# Patient Record
Sex: Female | Born: 1960 | Race: White | Hispanic: No | Smoking: Never smoker
Health system: Southern US, Community
[De-identification: ages and names within clinical notes are randomized; demographics above are authoritative.]

## PROBLEM LIST (undated history)

## (undated) DIAGNOSIS — G43909 Migraine, unspecified, not intractable, without status migrainosus: Secondary | ICD-10-CM

## (undated) DIAGNOSIS — M1990 Unspecified osteoarthritis, unspecified site: Secondary | ICD-10-CM

## (undated) DIAGNOSIS — E039 Hypothyroidism, unspecified: Secondary | ICD-10-CM

## (undated) DIAGNOSIS — R32 Unspecified urinary incontinence: Secondary | ICD-10-CM

## (undated) DIAGNOSIS — R51 Headache: Secondary | ICD-10-CM

## (undated) DIAGNOSIS — F329 Major depressive disorder, single episode, unspecified: Secondary | ICD-10-CM

## (undated) DIAGNOSIS — G629 Polyneuropathy, unspecified: Secondary | ICD-10-CM

## (undated) DIAGNOSIS — C029 Malignant neoplasm of tongue, unspecified: Secondary | ICD-10-CM

## (undated) DIAGNOSIS — F32A Depression, unspecified: Secondary | ICD-10-CM

## (undated) DIAGNOSIS — Z85048 Personal history of other malignant neoplasm of rectum, rectosigmoid junction, and anus: Secondary | ICD-10-CM

## (undated) DIAGNOSIS — E079 Disorder of thyroid, unspecified: Secondary | ICD-10-CM

## (undated) HISTORY — DX: Unspecified osteoarthritis, unspecified site: M19.90

## (undated) HISTORY — DX: Personal history of other malignant neoplasm of rectum, rectosigmoid junction, and anus: Z85.048

## (undated) HISTORY — DX: Depression, unspecified: F32.A

## (undated) HISTORY — DX: Polyneuropathy, unspecified: G62.9

## (undated) HISTORY — DX: Migraine, unspecified, not intractable, without status migrainosus: G43.909

## (undated) HISTORY — DX: Malignant neoplasm of tongue, unspecified: C02.9

## (undated) HISTORY — DX: Hypothyroidism, unspecified: E03.9

## (undated) HISTORY — DX: Headache: R51

## (undated) HISTORY — DX: Unspecified urinary incontinence: R32

## (undated) HISTORY — DX: Major depressive disorder, single episode, unspecified: F32.9

## (undated) HISTORY — DX: Disorder of thyroid, unspecified: E07.9

---
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
## 1991-07-02 HISTORY — PX: BREAST REDUCTION SURGERY: SHX8

## 1993-07-01 HISTORY — PX: CARPAL TUNNEL RELEASE: SHX101

## 2001-03-25 ENCOUNTER — Emergency Department (HOSPITAL_COMMUNITY): Admission: EM | Admit: 2001-03-25 | Discharge: 2001-03-25 | Payer: Self-pay | Admitting: Emergency Medicine

## 2008-01-24 ENCOUNTER — Observation Stay (HOSPITAL_COMMUNITY): Admission: EM | Admit: 2008-01-24 | Discharge: 2008-01-25 | Payer: Self-pay | Admitting: Emergency Medicine

## 2008-01-25 ENCOUNTER — Encounter (INDEPENDENT_AMBULATORY_CARE_PROVIDER_SITE_OTHER): Payer: Self-pay | Admitting: *Deleted

## 2008-05-17 ENCOUNTER — Ambulatory Visit (HOSPITAL_COMMUNITY): Payer: Self-pay | Admitting: Psychology

## 2008-05-25 ENCOUNTER — Ambulatory Visit (HOSPITAL_COMMUNITY): Payer: Self-pay | Admitting: Psychology

## 2008-06-08 ENCOUNTER — Ambulatory Visit (HOSPITAL_COMMUNITY): Payer: Self-pay | Admitting: Psychology

## 2009-06-18 IMAGING — CR DG CHEST 2V
2 series · 2 of 2 positions shown · non-contrast
Comparison: None

CLINICAL DATA: Chest pain, chest tightness

CHEST - 2 VIEW

[w chest pa]
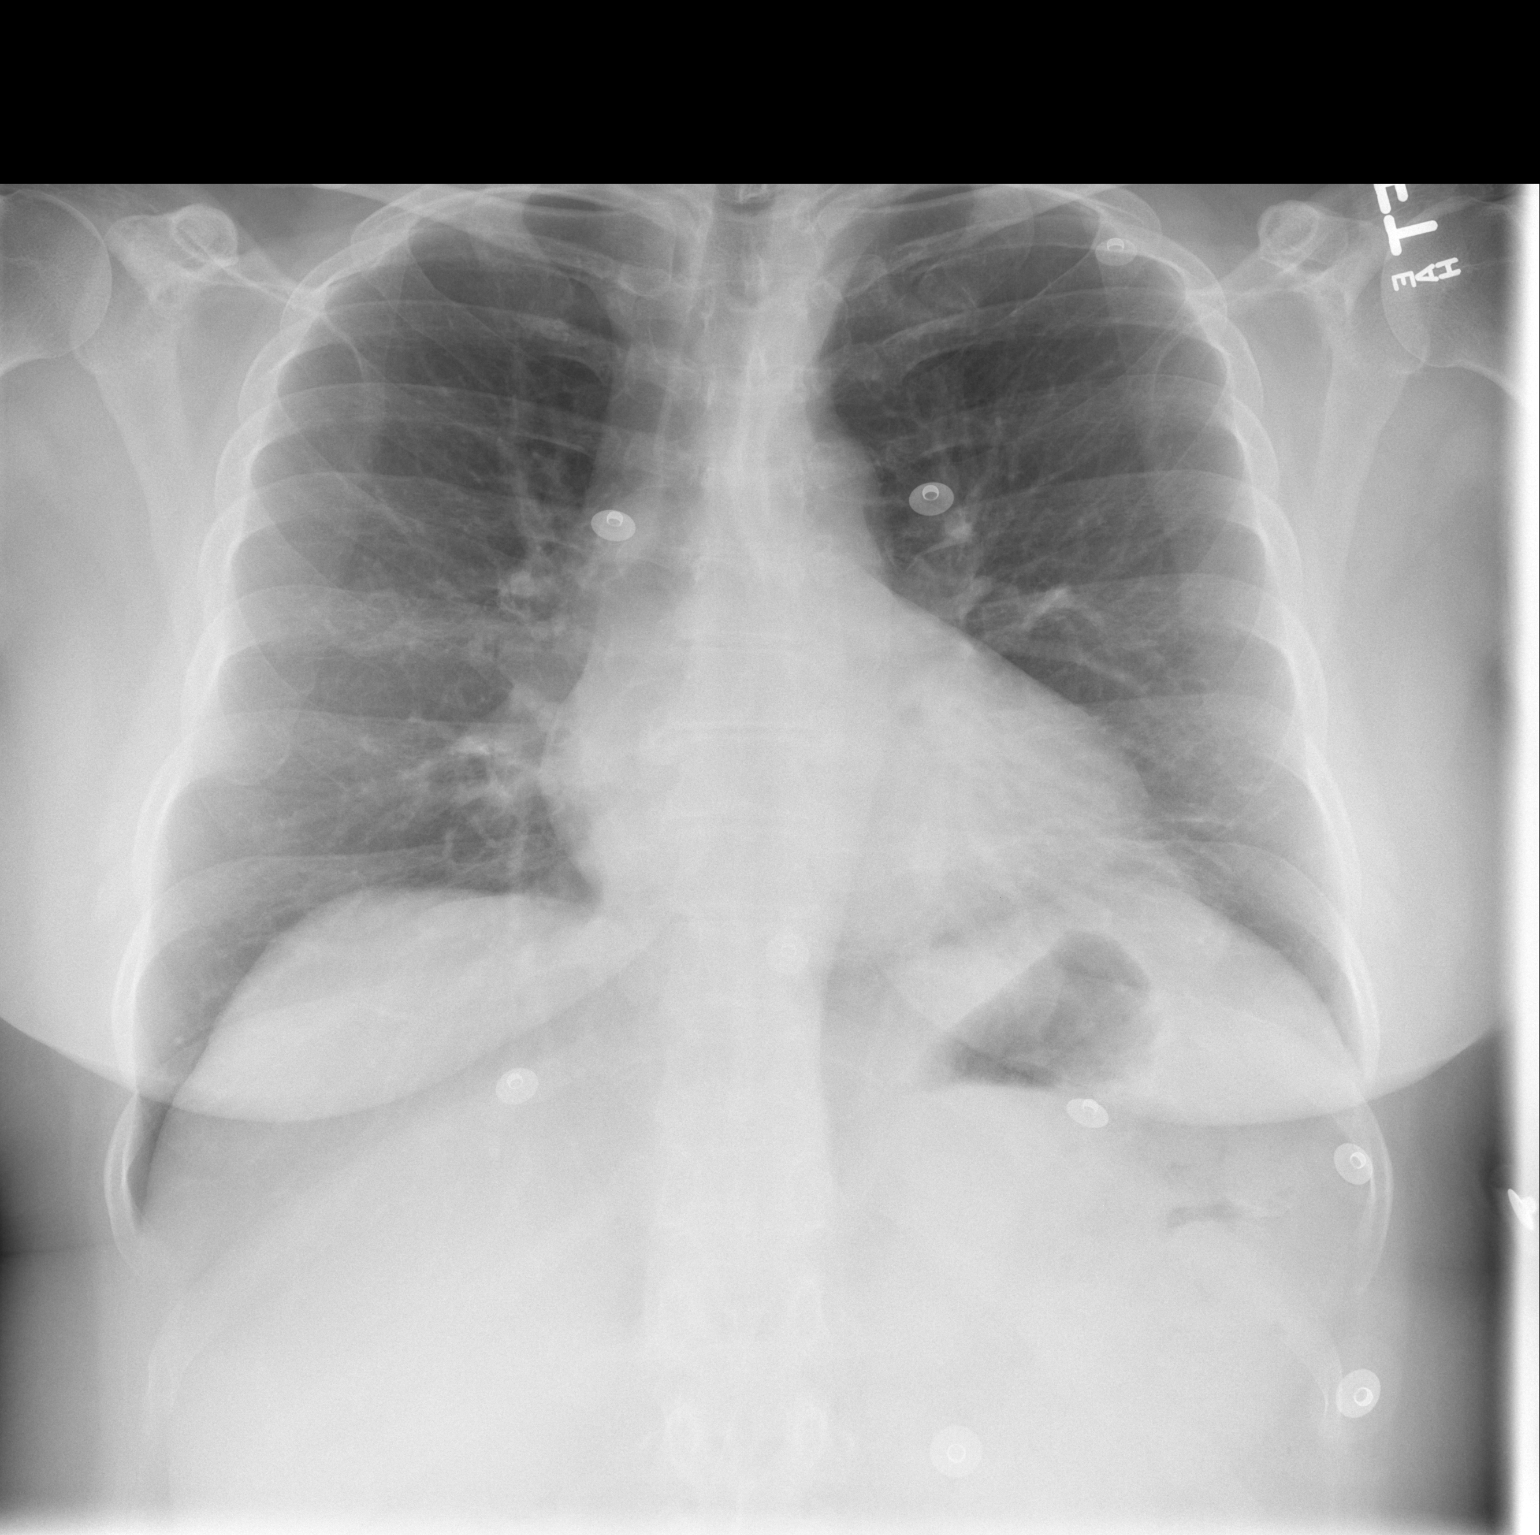

[w chest lat]
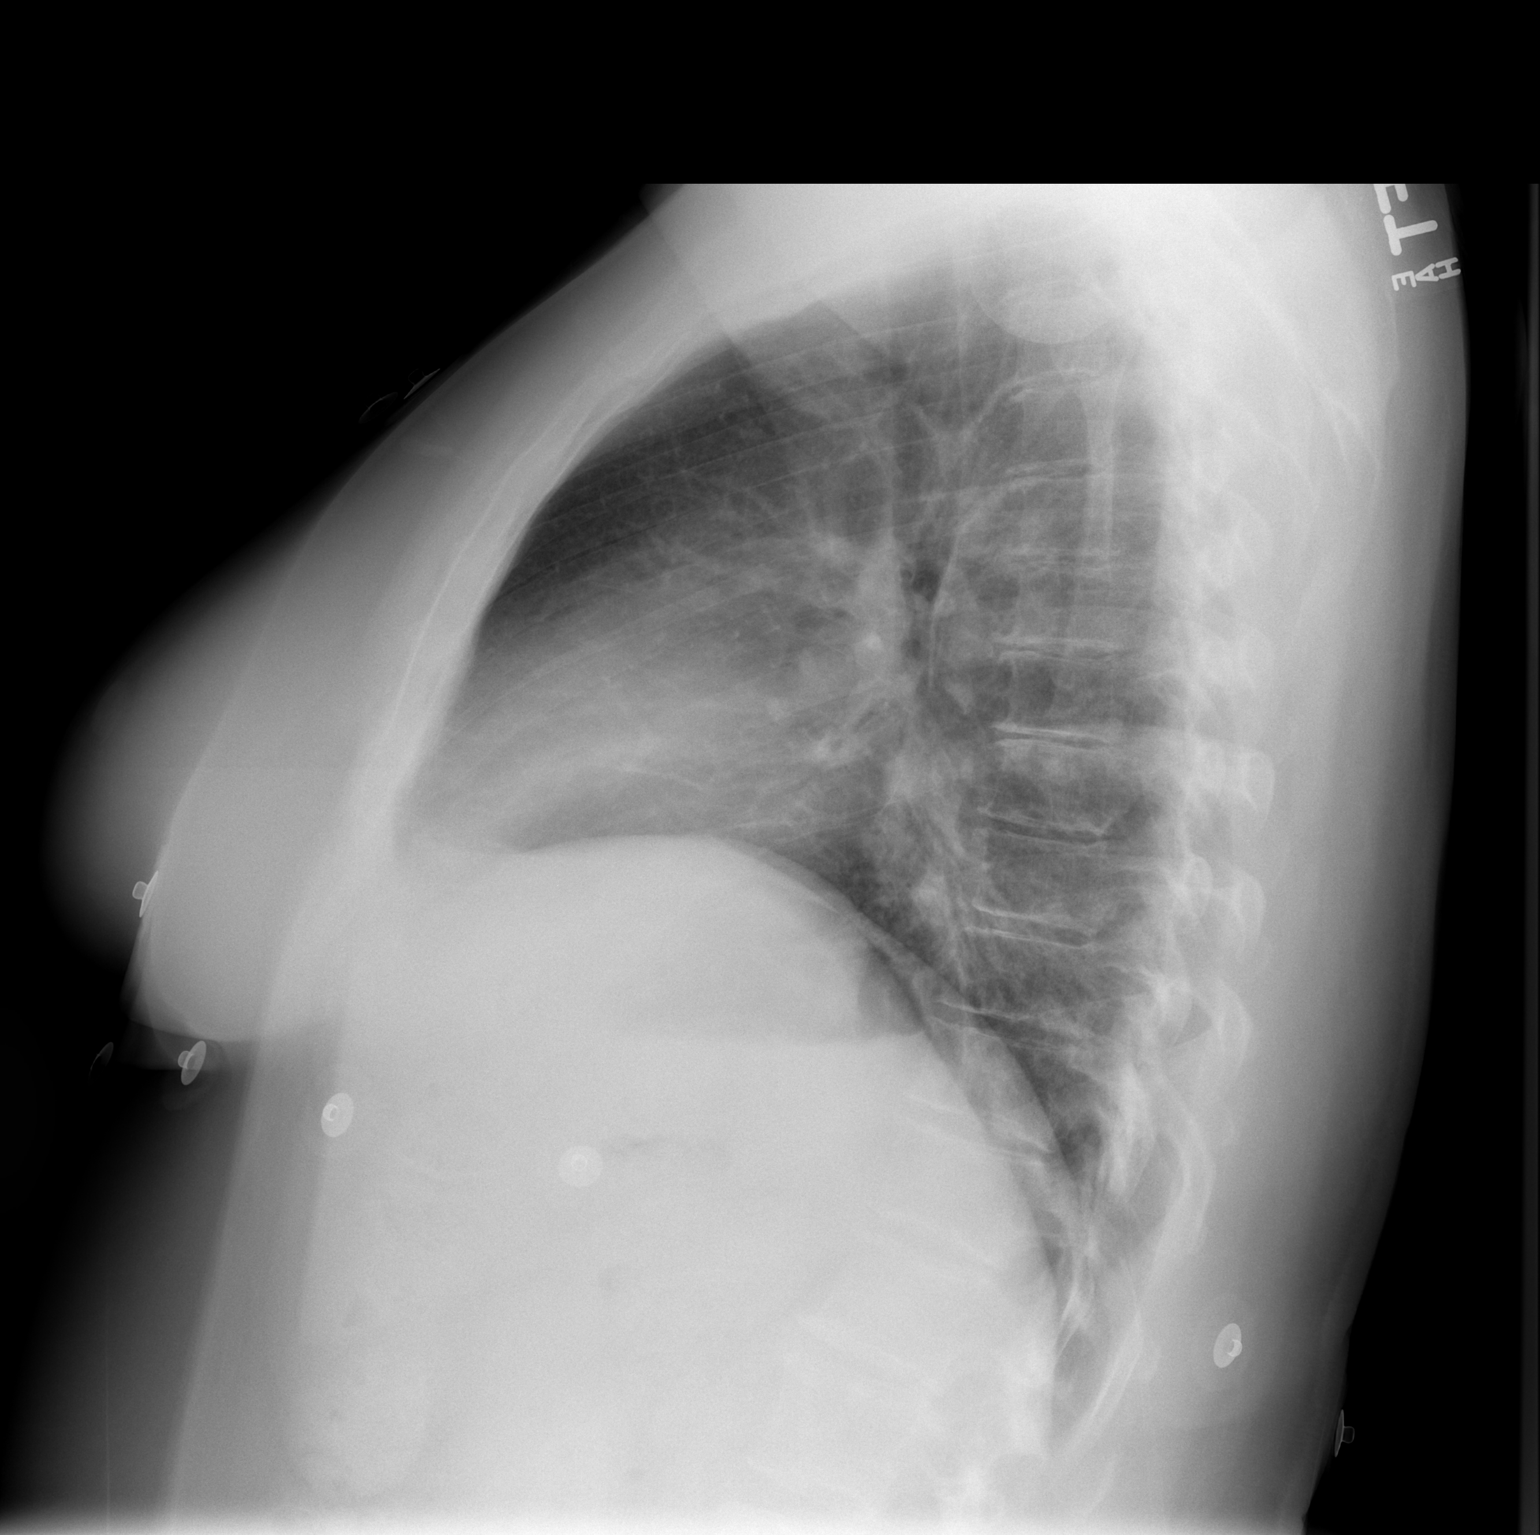

[2 of 2 positions shown; findings below may reference images not displayed]

FINDINGS: Normal mediastinum and heart silhouette.  Left
retrocardiac streaky opacity.  Costophrenic angles are clear.  No
pneumothorax.  Normal pulmonary vasculature.
IMPRESSION: Left lower lobe atelectasis versus infiltrate.

## 2009-08-31 ENCOUNTER — Ambulatory Visit: Payer: Self-pay | Admitting: Diagnostic Radiology

## 2009-08-31 ENCOUNTER — Emergency Department (HOSPITAL_BASED_OUTPATIENT_CLINIC_OR_DEPARTMENT_OTHER): Admission: EM | Admit: 2009-08-31 | Discharge: 2009-08-31 | Payer: Self-pay | Admitting: Emergency Medicine

## 2010-07-01 HISTORY — PX: OVARIAN CYST SURGERY: SHX726

## 2010-07-22 ENCOUNTER — Encounter: Payer: Self-pay | Admitting: Unknown Physician Specialty

## 2010-07-24 ENCOUNTER — Encounter
Admission: RE | Admit: 2010-07-24 | Discharge: 2010-07-24 | Payer: Self-pay | Source: Home / Self Care | Attending: Obstetrics and Gynecology | Admitting: Obstetrics and Gynecology

## 2010-08-15 ENCOUNTER — Ambulatory Visit (HOSPITAL_COMMUNITY)
Admission: RE | Admit: 2010-08-15 | Discharge: 2010-08-15 | Disposition: A | Discharge status: Home / Self Care | Payer: BC Managed Care – PPO | Source: Ambulatory Visit | Attending: Obstetrics & Gynecology | Admitting: Obstetrics & Gynecology

## 2010-08-15 ENCOUNTER — Other Ambulatory Visit: Payer: Self-pay | Admitting: Obstetrics & Gynecology

## 2010-08-15 DIAGNOSIS — R1900 Intra-abdominal and pelvic swelling, mass and lump, unspecified site: Secondary | ICD-10-CM | POA: Insufficient documentation

## 2010-08-15 DIAGNOSIS — N949 Unspecified condition associated with female genital organs and menstrual cycle: Secondary | ICD-10-CM | POA: Insufficient documentation

## 2010-08-15 DIAGNOSIS — N838 Other noninflammatory disorders of ovary, fallopian tube and broad ligament: Secondary | ICD-10-CM | POA: Insufficient documentation

## 2010-08-15 LAB — CBC
Hemoglobin: 13.6 g/dL (ref 12.0–15.0)
MCH: 29.8 pg (ref 26.0–34.0)
MCHC: 32.8 g/dL (ref 30.0–36.0)
RDW: 13.4 % (ref 11.5–15.5)

## 2010-08-15 LAB — TYPE AND SCREEN
ABO/RH(D): B POS
Antibody Screen: NEGATIVE

## 2010-09-12 NOTE — Op Note (Signed)
NAME:  Alice Pena, Alice Pena               ACCOUNT NO.:  [id number]  MEDICAL RECORD NO.:  [id number]           PATIENT TYPE:  O  LOCATION:  [facility]                          [facility]  PHYSICIAN:  Genia Del, M.D.DATE OF BIRTH:  [birth year]  DATE OF PROCEDURE:  08/15/2010 DATE OF DISCHARGE:                              OPERATIVE REPORT   PREOPERATIVE DIAGNOSIS:  Left adnexal lesion with left pelvic pain.  POSTOPERATIVE DIAGNOSES:  Left adnexal lesion with left pelvic pain, probable left paratubal adipose tissue and free lesion in pelvis.  PROCEDURE:  Laparoscopy, removal of free pelvic lesion, and excision of left paratubal lesion (probable adipose tissue).  SURGEON:  Genia Del, M.D.  ASSISTANT:  Arlan Organ, MD.  PROCEDURE IN DETAIL:  Under general anesthesia with endotracheal intubation, the patient is in lithotomy position.  She received a dose of Ancef 1 g IV before starting the procedure.  We prepped with Betadine on the abdominal, suprapubic, vulvar, and vaginal areas and draped as usual.  The vaginal exam revealed an anteverted uterus, normal volume, no adnexal mass felt.  The Foley is inserted in the bladder.  We then inserted the speculum in the vagina.  We grasped the anterior lip of the cervix with a tenaculum, and we cannulated the uterus with a one-tooth tenaculum cannula.  We removed the tenaculum on the anterior lip and removed the speculum.  We went to the abdomen.  We infiltrated the subcutaneous tissue with Marcaine 0.25 plain, 7 mL at the infraumbilical area.  We made a 1-cm incision with a scalpel at that level.  We opened the aponeurosis under direct vision with Mayo scissors and opened the parietal peritoneum under direct vision with Mayo scissors as well.  We put a pursestring stitch of Vicryl zero at the aponeurosis.  We inserted the Hasson at that level and then the camera.  The inspection of the pelvic cavity reveals a normal size and a  normal shape of the uterus. Both tubes are status post tubal with the proximal pole portion slightly dilated.  Both ovaries are normal to inspection.  We then put an additional port in the right iliac area.  We infiltrated the subcutaneous tissue with Marcaine 0.25 plain, 2 mL.  We made a 5-mm incision with a scalpel and entered a 5-mm trocar at that level.  We noted a 1-cm solid round lesion in the anterior cul-de-sac.  We switched to a 5-mm camera and removed that lesion through the infraumbilical port, that was sent to pathology.  We then inspected the pelvic cavity further.  There was no other lesion anteriorly.  No lesion in the posterior cul-de-sac.  The right ovary was completely normal.  The left ovary was completely normal.  At the level of the distal left tube, there was a 2-cm lesion which looked like adipose tissue attached to the left tube.  The decision was made to excise that lesion especially given that the location corresponded very well to the description by ultrasound and MRI, as well as the size.  We made an additional incision on the left iliac area after infiltrating Marcaine 0.25 plain.  We used a scalpel and made a 5-mm incision.  A 5-mm trocar was inserted at that level.  We were back with the regular camera at the infraumbilical port. We used the EnSeal to coagulate and section the left paratubal lesion. This was removed through a 5-mm port as the diameter was less than 5. We sent it to pathology with the other specimen.  Hemostasis was adequate at that level.  We visualized the appendix and took pictures. We visualized the liver and took pictures.  No abdominal lesion was present.  We took pictures before and after excising the left adnexal lesion in the pelvis.  The estimated blood loss was minimal.  The count of instruments and sponges was complete.  No complications occurred, and the patient was brought to recovery room in good stable  status.     Genia Del, M.D.     ML/MEDQ  D:  08/15/2010  T:  08/16/2010  Job:  [id number]  Electronically Signed by Genia Del M.D. on 09/12/2010 06:23:06 PM

## 2010-11-13 NOTE — H&P (Signed)
NAME:  Alice Pena, LOVE NO.:  [id number]   MEDICAL RECORD NO.:  [id number]          PATIENT TYPE:  INP   LOCATION:  3705                         FACILITY:  [facility]   PHYSICIAN:  Della Goo, M.D. DATE OF BIRTH:  Mar 12, 1961   DATE OF ADMISSION:  01/23/2008  DATE OF DISCHARGE:                              HISTORY & PHYSICAL   PRIMARY CARE PHYSICIAN:  Unassigned.   CHIEF COMPLAINT:  Chest pain.   HISTORY OF PRESENT ILLNESS:  This is a 50 year old female who was sent  emergently to the hospital secondary to complaints of severe substernal  area chest pain that started at about 8:30 p.m. while she was on her  job.  She reports having palpitations as well.  She reports having  associated symptoms of shortness of breath, nausea, diaphoresis and  describes the pain as being a heaviness in her chest which went across  the chest.  The patient described the pain as being a 10 out of 10 when  it did start.  At her job they took her vital signs and her blood  pressure was also found to be elevated and her heart rate was found to  be 145.  She was administered to nitroglycerin tablets and aspirin  therapy prior to emergency medical services arriving.  The patient was  given an additional nitroglycerin when emergency medical services  arrived.  When the patient arrived to the hospital she still had mild  chest discomfort, reported it as being a 4 out of 10. The patient denies  having any previous similar episodes to this.   PAST MEDICAL HISTORY:  Significant for hypothyroidism and history of  dysfunctional uterine bleeding.   MEDICATIONS:  At this time include:  1. Synthroid 0.75 mcg 1 p.o. daily.  2. Oral contraceptive pills.   ALLERGIES:  No known drug allergies.   SOCIAL HISTORY:  Nonsmoker, nondrinker.   FAMILY HISTORY:  Positive for ovarian cancer in her paternal grandmother  and no history of coronary artery disease, diabetes or hypertension in  her family.   REVIEW OF SYSTEMS:  Pertinents are mentioned above.   PHYSICAL EXAMINATION FINDINGS:  This is an overweight 50 year old female  in discomfort but no acute distress currently her.  Vital Signs:  Temperature 97.2.  Blood pressure 108/64.  Heart rate 74.  Respirations 20.  O2 saturation 99%.  HEENT:  Normocephalic, atraumatic.  There is no scleral icterus.  Pupils  are equally round reactive to light.  Extraocular movements are intact.  Funduscopic benign.  Oropharynx clear.  NECK:  Supple, full range of motion.  No thyromegaly, adenopathy or  jugular venous distention.  CARDIOVASCULAR:  Regular rate and rhythm.  No murmurs, gallops or rubs.  LUNGS:  Clear to auscultation bilaterally.  ABDOMEN:  Positive bowel sounds, soft, nontender, nondistended.  EXTREMITIES:  Without cyanosis, clubbing or edema.  NEUROLOGIC:  Nonfocal.   LABORATORY STUDIES:  White blood cell count 7.1, hemoglobin 12.2,  hematocrit 36.3 platelets 246, MCV 91.3, neutrophils 61%, lymphocytes  27%.  Sodium 139, potassium 4.0, chloride 107, bicarbonate 22, BUN 14,  creatinine 0.8 and  glucose 98.  Cardiac enzymes, a myoglobin of 28.0, CK-  MB less than 1.0 and troponin less than 0.05, D-dimer 0.44.  Chest x-ray  reveals left lower lobe atelectasis.   ASSESSMENT:  A 50 year old female being admitted with:  1. Substernal chest pain.  2. Palpitations.  3. Hypothyroidism.   PLAN:  The patient will be admitted to telemetry area.  Cardiac enzymes  will be performed.  The patient will be placed on beta blocker therapy,  nitrates, oxygen and aspirin therapy, p.r.n. sublingual nitroglycerin as  also been ordered.  The patient will be placed on DVT and GI  prophylaxis.  Further workup will ensue pending results of the patient's  clinical course and he studies.      Della Goo, M.D.  Electronically Signed     HJ/MEDQ  D:  01/24/2008  T:  01/24/2008  Job:  [id number]

## 2010-11-13 NOTE — Discharge Summary (Signed)
NAME:  Alice Pena, Alice Pena NO.:  [id number]   MEDICAL RECORD NO.:  [id number]         PATIENT TYPE:  CINP   LOCATION:                               FACILITY:  [facility]   PHYSICIAN:  Beckey Rutter, MD  DATE OF BIRTH:  1961-04-15   DATE OF ADMISSION:  01/24/2008  DATE OF DISCHARGE:                               DISCHARGE SUMMARY   PRIMARY CARE PHYSICIAN:  She is unassigned to [facility].   HISTORY OF PRESENT ILLNESS AND CHIEF COMPLAINT:  Briefly, this is a 50-  year-old female who presented with chest pain.   HOSPITAL COURSE:  1. During the hospital stay, the patient was ruled out for acute      coronary syndrome and myocardial infarction by serial EKG tracings      and cardiac enzymes.  The chest pain was resolved and the patient      is feeling better now with Tylenol medications.  I discussed the      findings with the patient and her husband and they elected to      follow up with the primary physician for further stratification.      She will have a 2-D echo today, although the result is pending, but      the patient is very stable to be released home.  She worked as a      Buyer, retail at [employer] and she stated that she      will follow up with the echo on her own.  2. The patient was found to have evidence of lower lobe atelectasis      for which she was started on Avelox.  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  Followup chest x-      ray was also recommended and she is aware and agreeable to this      discharge plan.  3. Hypothyroidism.  The patient was continued on her 75 mcg dose of      Synthroid.  Her TSH number is 5.114.   DISCHARGE DIAGNOSES:  1. Chest pain.  The patient ruled out for acute coronary      syndromes/myocardial infarction.  2. Evidence of  pneumonia on the chest x-ray.  3. Hypothyroidism.   DISCHARGE MEDICATIONS:  1. Synthroid 75 mcg daily.  2. Avelox to finish a course of 5 days, prescription given.   DISCHARGE PLAN:  The patient is discharged to follow up with her primary  physician to continue further assessment and stratification for heart  disease as discussed with her.       Beckey Rutter, MD  Electronically Signed     EME/MEDQ  D:  01/25/2008  T:  01/26/2008  Job:  [id number]

## 2011-03-29 LAB — CARDIAC PANEL(CRET KIN+CKTOT+MB+TROPI)
Relative Index: INVALID
Total CK: 76

## 2011-03-29 LAB — CK TOTAL AND CKMB (NOT AT ARMC)
CK, MB: 1.3
Relative Index: INVALID
Total CK: 89

## 2011-03-29 LAB — DIFFERENTIAL
Basophils Absolute: 0
Basophils Relative: 1
Eosinophils Relative: 1
Monocytes Absolute: 0.8
Monocytes Relative: 11

## 2011-03-29 LAB — CBC
HCT: 34.7 — ABNORMAL LOW
HCT: 36.3
Hemoglobin: 11.6 — ABNORMAL LOW
Hemoglobin: 12.2
MCHC: 33.3
MCHC: 33.6
MCV: 91.3
MCV: 91.4
RBC: 3.79 — ABNORMAL LOW
RDW: 12.8
RDW: 13

## 2011-03-29 LAB — BASIC METABOLIC PANEL
CO2: 28
Calcium: 8.7
Creatinine, Ser: 0.67
GFR calc non Af Amer: >60
Glucose, Bld: 113 — ABNORMAL HIGH
Sodium: 142

## 2011-03-29 LAB — POCT I-STAT, CHEM 8
BUN: 14
Calcium, Ion: 1.02 — ABNORMAL LOW
Chloride: 107
Glucose, Bld: 98
Potassium: 4

## 2011-03-29 LAB — TSH: TSH: 5.114 — ABNORMAL HIGH

## 2011-07-08 ENCOUNTER — Ambulatory Visit: Payer: BC Managed Care – PPO | Admitting: Family

## 2011-07-15 ENCOUNTER — Ambulatory Visit (INDEPENDENT_AMBULATORY_CARE_PROVIDER_SITE_OTHER): Payer: BC Managed Care – PPO | Admitting: Family

## 2011-07-15 ENCOUNTER — Encounter: Payer: Self-pay | Admitting: Family

## 2011-07-15 VITALS — BP 120/80 | Temp 98.2°F | Ht 60.0 in | Wt 176.0 lb

## 2011-07-15 DIAGNOSIS — R5383 Other fatigue: Secondary | ICD-10-CM

## 2011-07-15 DIAGNOSIS — R5381 Other malaise: Secondary | ICD-10-CM

## 2011-07-15 DIAGNOSIS — Z0000 Encounter for general adult medical examination without abnormal findings: Secondary | ICD-10-CM

## 2011-07-15 DIAGNOSIS — G4700 Insomnia, unspecified: Secondary | ICD-10-CM

## 2011-07-15 DIAGNOSIS — Z1231 Encounter for screening mammogram for malignant neoplasm of breast: Secondary | ICD-10-CM

## 2011-07-15 DIAGNOSIS — E039 Hypothyroidism, unspecified: Secondary | ICD-10-CM

## 2011-07-15 LAB — CBC WITH DIFFERENTIAL/PLATELET
Basophils Relative: 0.4 % (ref 0.0–3.0)
Eosinophils Relative: 1.7 % (ref 0.0–5.0)
Lymphocytes Relative: 23.3 % (ref 12.0–46.0)
MCV: 90.3 fl (ref 78.0–100.0)
Monocytes Absolute: 0.6 10*3/uL (ref 0.1–1.0)
Neutrophils Relative %: 64.9 % (ref 43.0–77.0)
Platelets: 273 10*3/uL (ref 150.0–400.0)
RBC: 4.41 Mil/uL (ref 3.87–5.11)
WBC: 6 10*3/uL (ref 4.5–10.5)

## 2011-07-15 LAB — LIPID PANEL
HDL: 51.9 mg/dL (ref >39.00)
Total CHOL/HDL Ratio: 4
VLDL: 23.2 mg/dL (ref 0.0–40.0)

## 2011-07-15 LAB — LDL CHOLESTEROL, DIRECT: Direct LDL: 166.6 mg/dL

## 2011-07-15 LAB — BASIC METABOLIC PANEL
BUN: 13 mg/dL (ref 6–23)
CO2: 24 mEq/L (ref 19–32)
Chloride: 104 mEq/L (ref 96–112)
Glucose, Bld: 93 mg/dL (ref 70–99)
Potassium: 3.5 mEq/L (ref 3.5–5.1)
Sodium: 139 mEq/L (ref 135–145)

## 2011-07-15 LAB — TSH: TSH: 1.2 u[IU]/mL (ref 0.35–5.50)

## 2011-07-15 NOTE — Patient Instructions (Signed)
Exercise to Stay Healthy Exercise helps you become and stay healthy. EXERCISE IDEAS AND TIPS Choose exercises that:  You enjoy.   Fit into your day.  You do not need to exercise really hard to be healthy. You can do exercises at a slow or medium level and stay healthy. You can:  Stretch before and after working out.   Try yoga, Pilates, or tai chi.   Lift weights.   Walk fast, swim, jog, run, climb stairs, bicycle, dance, or rollerskate.   Take aerobic classes.  Exercises that burn about 150 calories:  Running 1  miles in 15 minutes.   Playing volleyball for 45 to 60 minutes.   Washing and waxing a car for 45 to 60 minutes.   Playing touch football for 45 minutes.   Walking 1  miles in 35 minutes.   Pushing a stroller 1  miles in 30 minutes.   Playing basketball for 30 minutes.   Raking leaves for 30 minutes.   Bicycling 5 miles in 30 minutes.   Walking 2 miles in 30 minutes.   Dancing for 30 minutes.   Shoveling snow for 15 minutes.   Swimming laps for 20 minutes.   Walking up stairs for 15 minutes.   Bicycling 4 miles in 15 minutes.   Gardening for 30 to 45 minutes.   Jumping rope for 15 minutes.   Washing windows or floors for 45 to 60 minutes.  Document Released: 07/20/2010 Document Revised: 02/27/2011 Document Reviewed: 07/20/2010 [employer] Patient Information 2012 [employer]. 

## 2011-07-15 NOTE — Progress Notes (Signed)
Subjective:    Patient ID: Alice Pena, female    DOB: Nov 14, 1960, 51 y.o.   MRN: [id number]  HPI 51 year old white female, new to the practice, and to establish care and have a complete physical exam. She has a history of anxiety, insomnia, hypothyroidism and does well on most of her medications. She continues to have fatigue and hair loss despite her thyroid level being within normal range. She's been taking Synthroid and Levothyroxin. She would like to consider Armour Thyroid.    Review of Systems  Constitutional: Positive for fatigue.  HENT: Negative.   Eyes: Negative.   Respiratory: Negative.   Cardiovascular: Negative.   Gastrointestinal: Negative.   Genitourinary: Negative.   Musculoskeletal: Negative.   Skin: Negative.   Neurological: Negative.   Hematological: Negative.   Psychiatric/Behavioral: Negative.    Past Medical History  Diagnosis Date  . Arthritis   . Depression     post trauma   . Thyroid disease   . Headache   . Migraines   . Urine incontinence     History   Social History  . Marital Status: Divorced    Spouse Name: N/A    Number of Children: N/A  . Years of Education: N/A   Occupational History  . Not on file.   Social History Main Topics  . Smoking status: Never Smoker   . Smokeless tobacco: Never Used  . Alcohol Use: Yes     socially   . Drug Use: Not on file  . Sexually Active: Not on file   Other Topics Concern  . Not on file   Social History Narrative  . No narrative on file    Past Surgical History  Procedure Date  . Cesarean section 1989  . Carpal tunnel release 1995    x2  . Ovarian cyst surgery 2012    behind left ovary   . Breast reduction surgery 1993  . Tonsillectomy and adenoidectomy 1968    Family History  Problem Relation Age of Onset  . Aneurysm Mother     brain  . Healthy Father   . Cancer Paternal Grandmother     cervical  . Asthma Paternal Grandmother   . Alcohol abuse Paternal Grandfather   .  Asthma Paternal Grandfather     Allergies  Allergen Reactions  . Morphine And Related Nausea And Vomiting    No current outpatient prescriptions on file prior to visit.    BP 120/80  Temp(Src) 98.2 F (36.8 C) (Oral)  Ht 5' (1.524 m)  Wt 176 lb (79.833 kg)  BMI 34.37 kg/m2  LMP 01/14/2013chart    Objective:   Physical Exam  Constitutional: She is oriented to person, place, and time. She appears well-developed and well-nourished.  HENT:  Head: Normocephalic and atraumatic.  Right Ear: External ear normal.  Left Ear: External ear normal.  Nose: Nose normal.  Mouth/Throat: Oropharynx is clear and moist.  Eyes: Conjunctivae and EOM are normal. Pupils are equal, round, and reactive to light.  Neck: Normal range of motion. Neck supple.  Cardiovascular: Normal rate, regular rhythm and normal heart sounds.   Pulmonary/Chest: Effort normal and breath sounds normal.  Abdominal: Soft. Bowel sounds are normal.  Genitourinary:       Deferred to GYN.  Musculoskeletal: Normal range of motion.  Neurological: She is alert and oriented to person, place, and time.  Skin: Skin is warm and dry.          Assessment & Plan:  Assessment: Complete  physical exam, hypothyroidism, fatigue  Plan: Patient will continue her gynecological care with her OB/GYN. No family history of colon cancer, therefore colonoscopy not indicated at this point. Mammogram requisition sent. Labs drawn to include BMP, CBC, lipid, and TSH. Notify patient of the results of her labs. Will refill medications after labs are drawn. She will like to consider a switch from Synthroid to Armour Thyroid due to her continued hypothyroid symptoms despite being in normal range but her TSH. We'll consider this request pending the results of her labs. Exercise, daily multivitamin, healthy eating habits.

## 2011-07-18 ENCOUNTER — Telehealth: Payer: Self-pay

## 2011-07-18 MED ORDER — THYROID 60 MG PO TABS: 60.0000 mg | ORAL_TABLET | Freq: Every day | ORAL | Status: DC

## 2011-07-18 MED ORDER — ZOLPIDEM TARTRATE 10 MG PO TABS: 10.0000 mg | ORAL_TABLET | Freq: Every evening | ORAL | Status: DC | PRN

## 2011-07-18 NOTE — Telephone Encounter (Signed)
Pt aware and would like to try Armour Thyroid.   Discussed with Padonda. Send Amour Thyroid 60 mg to pharmacy and have pt rechk in 4-6 weeks  Pt aware and will call back to schedule lab appointment. Scripts sent to pharmacy

## 2011-07-18 NOTE — Telephone Encounter (Signed)
Message copied by Beverely Low on Thu Jul 18, 2011  2:36 PM ------      Message from: [place], [place] B      Created: Tue Jul 16, 2011  8:32 AM       Labs are all stable. Watch intake of fatty foods and fried food. Cholesterol mildly elevated. Would you still like to try Armour Thyroid?

## 2011-07-29 ENCOUNTER — Other Ambulatory Visit: Payer: Self-pay

## 2011-07-29 MED ORDER — ALPRAZOLAM 0.5 MG PO TABS: 0.5000 mg | ORAL_TABLET | Freq: Every day | ORAL | Status: DC

## 2011-07-29 NOTE — Telephone Encounter (Signed)
May refill with 2 additional refills. 

## 2011-07-29 NOTE — Telephone Encounter (Signed)
May refills with 2 additional refills

## 2011-07-29 NOTE — Telephone Encounter (Signed)
Last OV 07/15/11. Med has not been filled by [facility] yet

## 2011-07-29 NOTE — Telephone Encounter (Signed)
Rx printed and faxed.  

## 2011-07-30 ENCOUNTER — Other Ambulatory Visit: Payer: Self-pay

## 2011-07-30 DIAGNOSIS — Z1231 Encounter for screening mammogram for malignant neoplasm of breast: Secondary | ICD-10-CM

## 2011-08-05 ENCOUNTER — Telehealth: Payer: Self-pay | Admitting: Family

## 2011-08-05 ENCOUNTER — Other Ambulatory Visit: Payer: Self-pay | Admitting: Family

## 2011-08-05 MED ORDER — ALPRAZOLAM 0.5 MG PO TABS: 0.5000 mg | ORAL_TABLET | Freq: Every day | ORAL | Status: DC

## 2011-08-05 NOTE — Telephone Encounter (Signed)
Pt is requesting an order to have tsh labs. She stated that her thyroid rx was adjusted and she wants to have it checked. Thanks.

## 2011-08-05 NOTE — Telephone Encounter (Signed)
Pt would like refill on alprazolam call into [facility] [phone]

## 2011-08-05 NOTE — Telephone Encounter (Signed)
Spoke with pharmacy and they state that the Rx was not received.  Rx refaxed

## 2011-08-05 NOTE — Telephone Encounter (Signed)
Ordered

## 2011-08-13 ENCOUNTER — Ambulatory Visit: Payer: BC Managed Care – PPO

## 2011-09-10 ENCOUNTER — Other Ambulatory Visit: Payer: Self-pay | Admitting: Family

## 2011-09-10 MED ORDER — ZOLPIDEM TARTRATE ER 12.5 MG PO TBCR: 12.5000 mg | EXTENDED_RELEASE_TABLET | Freq: Every evening | ORAL | Status: DC | PRN

## 2011-09-10 NOTE — Telephone Encounter (Signed)
Rx faxed to pharmacy  

## 2011-09-10 NOTE — Telephone Encounter (Signed)
Ok to refill 

## 2011-09-10 NOTE — Telephone Encounter (Signed)
Last OV 07/15/2011. Last fill date 07/18/11 #15 with 1 refill

## 2011-09-10 NOTE — Telephone Encounter (Signed)
Pt requesting refill on zolpidem (AMBIEN CR) 12.5 MG CR tablet   [facility] [address] [place]

## 2011-10-15 ENCOUNTER — Telehealth: Payer: Self-pay | Admitting: Family

## 2011-10-15 NOTE — Telephone Encounter (Signed)
Patient called stating that with receiving ambien 12.5/15 tabs cr she is paying an amount that she would pay for 30. Patient wants to know if she can have 30 or if she can have the ambien 10.0 so that she may get the generic to save her money. Please advise.

## 2011-10-17 MED ORDER — ZOLPIDEM TARTRATE 10 MG PO TABS: 10.0000 mg | ORAL_TABLET | Freq: Every evening | ORAL | Status: DC | PRN

## 2011-10-17 NOTE — Telephone Encounter (Signed)
Ambien 10mg  #30, 3 RF. Do no take [place] everyday, has the potential to become habit forming.

## 2011-10-17 NOTE — Telephone Encounter (Signed)
Ambien 10mg #30, 3 RF. Do no take ambien everyday, has the potential to become habit forming.   

## 2011-10-17 NOTE — Telephone Encounter (Signed)
Addended by: Beverely Low on: 10/17/2011 10:00 AM   Modules accepted: Orders

## 2011-10-17 NOTE — Telephone Encounter (Signed)
Rx faxed to pharmacy  

## 2011-10-22 ENCOUNTER — Ambulatory Visit: Payer: BC Managed Care – PPO | Admitting: Family

## 2012-01-13 ENCOUNTER — Ambulatory Visit (HOSPITAL_BASED_OUTPATIENT_CLINIC_OR_DEPARTMENT_OTHER)
Admission: RE | Admit: 2012-01-13 | Discharge: 2012-01-13 | Disposition: A | Discharge status: Home / Self Care | Payer: BC Managed Care – PPO | Source: Ambulatory Visit | Attending: Family | Admitting: Family

## 2012-01-13 DIAGNOSIS — Z1231 Encounter for screening mammogram for malignant neoplasm of breast: Secondary | ICD-10-CM | POA: Insufficient documentation

## 2012-01-18 ENCOUNTER — Other Ambulatory Visit: Payer: Self-pay | Admitting: Family

## 2012-01-31 ENCOUNTER — Encounter: Payer: Self-pay | Admitting: Family

## 2012-01-31 ENCOUNTER — Ambulatory Visit (INDEPENDENT_AMBULATORY_CARE_PROVIDER_SITE_OTHER): Payer: BC Managed Care – PPO | Admitting: Family

## 2012-01-31 VITALS — BP 120/82 | HR 80 | Temp 98.4°F | Resp 14 | Ht 60.0 in | Wt 176.0 lb

## 2012-01-31 DIAGNOSIS — M2540 Effusion, unspecified joint: Secondary | ICD-10-CM

## 2012-01-31 DIAGNOSIS — M2550 Pain in unspecified joint: Secondary | ICD-10-CM

## 2012-01-31 DIAGNOSIS — E039 Hypothyroidism, unspecified: Secondary | ICD-10-CM

## 2012-01-31 LAB — BASIC METABOLIC PANEL
BUN: 17 mg/dL (ref 6–23)
Chloride: 105 mEq/L (ref 96–112)
GFR: 153.01 mL/min (ref >60.00)
Potassium: 4 mEq/L (ref 3.5–5.1)
Sodium: 139 mEq/L (ref 135–145)

## 2012-01-31 LAB — TSH: TSH: 1.77 u[IU]/mL (ref 0.35–5.50)

## 2012-01-31 MED ORDER — NAPROXEN 500 MG PO TABS: 500.0000 mg | ORAL_TABLET | Freq: Two times a day (BID) | ORAL | Status: DC

## 2012-01-31 MED ORDER — TRAMADOL HCL 50 MG PO TABS: 50.0000 mg | ORAL_TABLET | Freq: Three times a day (TID) | ORAL | Status: DC | PRN

## 2012-01-31 NOTE — Progress Notes (Signed)
Subjective:    Patient ID: Alice Pena, female    DOB: 12-17-1960, 51 y.o.   MRN: [id number]  HPI 51 year old WF is in today with c/o pain to her hips and left shoulder that has worsened over the last week.She has a history of Osteoarthritis. She rates her pain 8/10, worsen with sitting for long periods of time. She has taken Asprin that only helps temporarily.   Review of Systems  Constitutional: Negative.   Respiratory: Negative.   Cardiovascular: Negative.   Gastrointestinal: Negative.   Genitourinary: Negative.   Musculoskeletal: Positive for arthralgias.  Neurological: Negative.   Hematological: Negative.   Psychiatric/Behavioral: Negative.    Past Medical History  Diagnosis Date  . Arthritis   . Depression     post trauma   . Thyroid disease   . Headache   . Migraines   . Urine incontinence     History   Social History  . Marital Status: Divorced    Spouse Name: N/A    Number of Children: N/A  . Years of Education: N/A   Occupational History  . Not on file.   Social History Main Topics  . Smoking status: Never Smoker   . Smokeless tobacco: Never Used  . Alcohol Use: Yes     socially   . Drug Use: Not on file  . Sexually Active: Not on file   Other Topics Concern  . Not on file   Social History Narrative  . No narrative on file    Past Surgical History  Procedure Date  . Cesarean section 1989  . Carpal tunnel release 1995    x2  . Ovarian cyst surgery 2012    behind left ovary   . Breast reduction surgery 1993  . Tonsillectomy and adenoidectomy 1968    Family History  Problem Relation Age of Onset  . Aneurysm Mother     brain  . Healthy Father   . Cancer Paternal Grandmother     cervical  . Asthma Paternal Grandmother   . Alcohol abuse Paternal Grandfather   . Asthma Paternal Grandfather     Allergies  Allergen Reactions  . Morphine And Related Nausea And Vomiting    Current Outpatient Prescriptions on File Prior to Visit    Medication Sig Dispense Refill  . ARMOUR THYROID 60 MG tablet TAKE 1 TABLET BY MOUTH EVERY DAY  30 tablet  1  . aspirin 81 MG tablet Take 162 mg by mouth daily.       . cholecalciferol (VITAMIN D) 1000 UNITS tablet Take 1,000 Units by mouth daily.      . fish oil-omega-3 fatty acids 1000 MG capsule Take 2 g by mouth daily.      Marland Kitchen levothyroxine (SYNTHROID, LEVOTHROID) 75 MCG tablet Take 75 mcg by mouth daily.      . Multiple Vitamin (MULTIVITAMIN) tablet Take 1 tablet by mouth daily.      Marland Kitchen OVER THE COUNTER MEDICATION Power C x 2 (gummy)      . sertraline (ZOLOFT) 50 MG tablet Take 50 mg by mouth daily.      . vitamin E 400 UNIT capsule Take 400 Units by mouth 2 (two) times daily.      Marland Kitchen zolpidem (AMBIEN) 10 MG tablet Take 1 tablet (10 mg total) by mouth at bedtime as needed for sleep.  30 tablet  3  . ALPRAZolam (XANAX) 0.5 MG tablet Take 1 tablet (0.5 mg total) by mouth daily.  30 tablet  2  . zolpidem (AMBIEN CR) 12.5 MG CR tablet Take 1 tablet (12.5 mg total) by mouth at bedtime as needed for sleep.  15 tablet  2  . zolpidem (AMBIEN) 10 MG tablet Take 1 tablet (10 mg total) by mouth at bedtime as needed for sleep.  15 tablet  1    BP 120/82  Pulse 80  Temp 98.4 F (36.9 C) (Oral)  Resp 14  Ht 5' (1.524 m)  Wt 176 lb (79.833 kg)  BMI 34.37 kg/m2  SpO2 98%  LMP 07/10/2013chart     Objective:   Physical Exam  Constitutional: She is oriented to person, place, and time. She appears well-developed and well-nourished.  Neck: Normal range of motion. Neck supple.  Cardiovascular: Normal rate, regular rhythm and normal heart sounds.   Pulmonary/Chest: Effort normal and breath sounds normal.  Abdominal: Soft. Bowel sounds are normal.  Musculoskeletal: Normal range of motion.       Tender to palpations of the greater trochanter bilaterally. No warmth.  Neurological: She is alert and oriented to person, place, and time.  Skin: Skin is warm and dry.  Psychiatric: She has a normal mood  and affect.          Assessment & Plan:  Assessment: Osteoarthritis, Multiple joint pain  Plan: Labs sent. Naprosyn twice daily. Tramadol as needed for pain.

## 2012-02-04 LAB — RHEUMATOID FACTOR: Rhuematoid fact SerPl-aCnc: 10 IU/mL (ref <=14)

## 2012-03-04 ENCOUNTER — Other Ambulatory Visit: Payer: Self-pay | Admitting: Family

## 2012-03-10 ENCOUNTER — Other Ambulatory Visit: Payer: Self-pay

## 2012-03-10 MED ORDER — ZOLPIDEM TARTRATE 10 MG PO TABS: 10.0000 mg | ORAL_TABLET | Freq: Every evening | ORAL | Status: DC | PRN

## 2012-03-14 ENCOUNTER — Other Ambulatory Visit: Payer: Self-pay | Admitting: Family

## 2012-03-31 ENCOUNTER — Telehealth: Payer: Self-pay | Admitting: Family

## 2012-03-31 MED ORDER — ZOLPIDEM TARTRATE 10 MG PO TABS: 10.0000 mg | ORAL_TABLET | Freq: Every evening | ORAL | Status: DC | PRN

## 2012-03-31 NOTE — Telephone Encounter (Signed)
Pt called and wants to know why her zolpidem (AMBIEN) 10 MG tablet , was only called in for #15? Pt says that she normally gets #30 and has been taking this med for years. [facility]s call in [facility] full [facility] [place] on [address].

## 2012-04-22 ENCOUNTER — Other Ambulatory Visit: Payer: Self-pay | Admitting: Family

## 2012-05-20 ENCOUNTER — Other Ambulatory Visit: Payer: Self-pay | Admitting: Family

## 2012-05-21 ENCOUNTER — Telehealth: Payer: Self-pay | Admitting: Family

## 2012-05-21 NOTE — Telephone Encounter (Signed)
Pt is trying to seek other options for taking her Ambien. She would like to know if you could refer her for massage or accupucture.  Phone [phone].

## 2012-05-22 NOTE — Telephone Encounter (Signed)
Can try taking Xanax at bedtime. Tylenol PM or Advil PM, Melatonin are all options for sleep. I am not sure of any insurance plan that covers massages. If her insurance does for some reason, ask her to find out who they "prefer" she see in network.

## 2012-05-22 NOTE — Telephone Encounter (Signed)
Left message to advise pt of Padonda's note. Advised pt to call back with questions or concerns

## 2012-06-17 ENCOUNTER — Other Ambulatory Visit: Payer: Self-pay | Admitting: Family

## 2012-06-22 ENCOUNTER — Other Ambulatory Visit: Payer: Self-pay | Admitting: Family

## 2012-06-29 ENCOUNTER — Other Ambulatory Visit: Payer: Self-pay | Admitting: Family

## 2012-08-01 ENCOUNTER — Other Ambulatory Visit: Payer: Self-pay | Admitting: Family

## 2012-08-03 ENCOUNTER — Other Ambulatory Visit: Payer: Self-pay | Admitting: Family

## 2012-08-04 ENCOUNTER — Other Ambulatory Visit: Payer: Self-pay | Admitting: Family

## 2012-08-29 ENCOUNTER — Other Ambulatory Visit: Payer: Self-pay | Admitting: Family

## 2012-09-07 ENCOUNTER — Ambulatory Visit: Payer: BC Managed Care – PPO | Admitting: Family

## 2012-09-11 ENCOUNTER — Encounter: Payer: Self-pay | Admitting: Family

## 2012-09-11 ENCOUNTER — Ambulatory Visit (INDEPENDENT_AMBULATORY_CARE_PROVIDER_SITE_OTHER): Payer: BC Managed Care – PPO | Admitting: Family

## 2012-09-11 ENCOUNTER — Telehealth: Payer: Self-pay | Admitting: Family

## 2012-09-11 VITALS — BP 116/82 | HR 85 | Wt 183.0 lb

## 2012-09-11 DIAGNOSIS — N951 Menopausal and female climacteric states: Secondary | ICD-10-CM

## 2012-09-11 DIAGNOSIS — M1710 Unilateral primary osteoarthritis, unspecified knee: Secondary | ICD-10-CM

## 2012-09-11 DIAGNOSIS — E039 Hypothyroidism, unspecified: Secondary | ICD-10-CM

## 2012-09-11 DIAGNOSIS — E7800 Pure hypercholesterolemia, unspecified: Secondary | ICD-10-CM

## 2012-09-11 DIAGNOSIS — F329 Major depressive disorder, single episode, unspecified: Secondary | ICD-10-CM

## 2012-09-11 DIAGNOSIS — F32A Depression, unspecified: Secondary | ICD-10-CM | POA: Insufficient documentation

## 2012-09-11 DIAGNOSIS — Z1211 Encounter for screening for malignant neoplasm of colon: Secondary | ICD-10-CM

## 2012-09-11 DIAGNOSIS — IMO0002 Reserved for concepts with insufficient information to code with codable children: Secondary | ICD-10-CM

## 2012-09-11 LAB — COMPREHENSIVE METABOLIC PANEL
Alkaline Phosphatase: 50 U/L (ref 39–117)
Creatinine, Ser: 0.7 mg/dL (ref 0.4–1.2)
GFR: 100.63 mL/min (ref >60.00)
Glucose, Bld: 99 mg/dL (ref 70–99)
Sodium: 138 mEq/L (ref 135–145)
Total Bilirubin: 0.5 mg/dL (ref 0.3–1.2)
Total Protein: 7.1 g/dL (ref 6.0–8.3)

## 2012-09-11 LAB — LIPID PANEL
Cholesterol: 223 mg/dL — ABNORMAL HIGH (ref 0–200)
HDL: 49.9 mg/dL (ref >39.00)
Total CHOL/HDL Ratio: 4
Triglycerides: 139 mg/dL (ref 0.0–149.0)

## 2012-09-11 LAB — LDL CHOLESTEROL, DIRECT: Direct LDL: 149.7 mg/dL

## 2012-09-11 LAB — TSH: TSH: 1.18 u[IU]/mL (ref 0.35–5.50)

## 2012-09-11 MED ORDER — PHENTERMINE HCL 37.5 MG PO CAPS: 37.5000 mg | ORAL_CAPSULE | ORAL | Status: DC

## 2012-09-11 MED ORDER — VENLAFAXINE HCL ER 37.5 MG PO CP24: 37.5000 mg | ORAL_CAPSULE | Freq: Every day | ORAL | Status: DC

## 2012-09-11 MED ORDER — DICLOFENAC SODIUM 75 MG PO TBEC: 75.0000 mg | DELAYED_RELEASE_TABLET | Freq: Two times a day (BID) | ORAL | Status: DC

## 2012-09-11 NOTE — Patient Instructions (Addendum)
1. Phentermine, let me know what you think.  Exercise to Lose Weight Exercise and a healthy diet may help you lose weight. Your doctor may suggest specific exercises. EXERCISE IDEAS AND TIPS  Choose low-cost things you enjoy doing, such as walking, bicycling, or exercising to workout videos.  Take stairs instead of the elevator.  Walk during your lunch break.  Park your car further away from work or school.  Go to a gym or an exercise class.  Start with 5 to 10 minutes of exercise each day. Build up to 30 minutes of exercise 4 to 6 days a week.  Wear shoes with good support and comfortable clothes.  Stretch before and after working out.  Work out until you breathe harder and your heart beats faster.  Drink extra water when you exercise.  Do not do so much that you hurt yourself, feel dizzy, or get very short of breath. Exercises that burn about 150 calories:  Running 1  miles in 15 minutes.  Playing volleyball for 45 to 60 minutes.  Washing and waxing a car for 45 to 60 minutes.  Playing touch football for 45 minutes.  Walking 1  miles in 35 minutes.  Pushing a stroller 1  miles in 30 minutes.  Playing basketball for 30 minutes.  Raking leaves for 30 minutes.  Bicycling 5 miles in 30 minutes.  Walking 2 miles in 30 minutes.  Dancing for 30 minutes.  Shoveling snow for 15 minutes.  Swimming laps for 20 minutes.  Walking up stairs for 15 minutes.  Bicycling 4 miles in 15 minutes.  Gardening for 30 to 45 minutes.  Jumping rope for 15 minutes.  Washing windows or floors for 45 to 60 minutes. Document Released: 07/20/2010 Document Revised: 09/09/2011 Document Reviewed: 07/20/2010 [facility] Patient Information 2013 [place], [place].

## 2012-09-11 NOTE — Progress Notes (Signed)
Subjective:    Patient ID: Alice Pena, female    DOB: [birth year], 52 y.o.   MRN: [id number]  HPI 52 year old white female, nonsmoker is in for recheck of depression, hypercholesterolemia, hypothyroidism, anxiety, generalized osteoarthritis, and insomnia. She reports Naprosyn not working any longer for arthritis. She also feels more moody, anxious, and having hot flashes related to menopause. She has not taken any hormone replacement. Reports dietary changes but continues to not be able to control her weight. She is not exercising due to pain. Pain is worse in the mornings and better as the day progresses.    Review of Systems  Constitutional: Negative.   HENT: Negative.   Respiratory: Negative.   Cardiovascular: Negative.   Gastrointestinal: Negative.   Endocrine: Negative.   Genitourinary: Negative.   Musculoskeletal: Negative.   Skin: Negative.   Neurological: Negative.   Hematological: Negative.   Psychiatric/Behavioral: Negative.    Past Medical History  Diagnosis Date  . Arthritis   . Depression     post trauma   . Thyroid disease   . Headache   . Migraines   . Urine incontinence     History   Social History  . Marital Status: Divorced    Spouse Name: N/A    Number of Children: N/A  . Years of Education: N/A   Occupational History  . Not on file.   Social History Main Topics  . Smoking status: Never Smoker   . Smokeless tobacco: Never Used  . Alcohol Use: Yes     Comment: socially   . Drug Use: Not on file  . Sexually Active: Not on file   Other Topics Concern  . Not on file   Social History Narrative  . No narrative on file    Past Surgical History  Procedure Laterality Date  . Cesarean section  1989  . Carpal tunnel release  1995    x2  . Ovarian cyst surgery  2012    behind left ovary   . Breast reduction surgery  1993  . Tonsillectomy and adenoidectomy  1968    Family History  Problem Relation Age of Onset  . Aneurysm Mother    brain  . Healthy Father   . Cancer Paternal Grandmother     cervical  . Asthma Paternal Grandmother   . Alcohol abuse Paternal Grandfather   . Asthma Paternal Grandfather     Allergies  Allergen Reactions  . Morphine And Related Nausea And Vomiting    Current Outpatient Prescriptions on File Prior to Visit  Medication Sig Dispense Refill  . ARMOUR THYROID 60 MG tablet TAKE 1 TABLET BY MOUTH EVERY DAY  30 tablet  1  . ARMOUR THYROID 60 MG tablet TAKE 1 TABLET EVERY DAY  30 tablet  1  . aspirin 81 MG tablet Take 162 mg by mouth daily.       . cholecalciferol (VITAMIN D) 1000 UNITS tablet Take 1,000 Units by mouth daily.      . fish oil-omega-3 fatty acids 1000 MG capsule Take 2 g by mouth daily.      . Multiple Vitamin (MULTIVITAMIN) tablet Take 1 tablet by mouth daily.      Marland Kitchen OVER THE COUNTER MEDICATION Power C x 2 (gummy)      . traMADol (ULTRAM) 50 MG tablet TAKE 1 TABLET EVERY 8 HOURS AS NEEDED PAIN  30 tablet  0  . traMADol (ULTRAM) 50 MG tablet TAKE 1 TABLET EVERY 8 HOURS AS NEEDED PAIN  30 tablet  0  . zolpidem (AMBIEN) 10 MG tablet TAKE 1 TAB BY MOUTH AT BEDTIME AS NEEDED FOR SLEEP. DONT TAKE EVERY NIGHT: MAY BECOME HABIT FORMING  30 tablet  1  . zolpidem (AMBIEN) 10 MG tablet TAKE 1 TABLET AT BEDTIME AS NEEDED SLEEP  30 tablet  1  . ALPRAZolam (XANAX) 0.5 MG tablet Take 1 tablet (0.5 mg total) by mouth daily.  30 tablet  2  . levothyroxine (SYNTHROID, LEVOTHROID) 75 MCG tablet Take 75 mcg by mouth daily.      . vitamin E 400 UNIT capsule Take 400 Units by mouth 2 (two) times daily.      Marland Kitchen zolpidem (AMBIEN) 10 MG tablet Take 1 tablet (10 mg total) by mouth at bedtime as needed for sleep.  30 tablet  3  . zolpidem (AMBIEN) 10 MG tablet Take 1 tablet (10 mg total) by mouth at bedtime as needed for sleep. DO NOT TAKE EVERY NIGHT AS IT CAN BECOME HABIT FORMING  30 tablet  1   No current facility-administered medications on file prior to visit.    BP 116/82  Pulse 85  Wt 183 lb  (83.008 kg)  BMI 35.74 kg/m2  SpO2 98%chart    Objective:   Physical Exam  Constitutional: She is oriented to person, place, and time. She appears well-developed and well-nourished.  HENT:  Right Ear: External ear normal.  Left Ear: External ear normal.  Nose: Nose normal.  Mouth/Throat: Oropharynx is clear and moist.  Neck: Normal range of motion. Neck supple. No thyromegaly present.  Cardiovascular: Normal rate, regular rhythm and normal heart sounds.   Pulmonary/Chest: Effort normal and breath sounds normal.  Musculoskeletal: Normal range of motion.  Neurological: She is alert and oriented to person, place, and time.  Skin: Skin is warm and dry.  Psychiatric: She has a normal mood and affect.          Assessment & Plan:  Assessment: 1. Depression 2. Hypercholesterolemia 3. Hypothyroidism 4. Anxiety 5. Osteoartthritis  6. Menopausal Symptoms  Plan: Labs sent. Changed Zoloft to Effexor 37.5mg . Consider Phentermine for weight reduction. Change Naprosyn to Voltaren twice a day. Follow-up pending results. Recheck arthritis and menopausal symptoms in 3 weeks.

## 2012-09-11 NOTE — Telephone Encounter (Signed)
Patient Information:  Caller Name: Manar  Phone: [phone]  Patient: Alice Pena  Gender: Female  DOB: 17-Dec-1960  Age: 52 Years  PCP: Adline Mango [facility])  Pregnant: No  Office Follow Up:  Does the office need to follow up with this patient?: Yes  Instructions For The Office: OFFICE PLEASE LET NP CAMPBELL KNOW THAT PT WOULD LIKE TO BEGIN APPETITE SUPPRESSANT   Symptoms  Reason For Call & Symptoms: pt was seen in the office today and was told to do some research and then callback if she decides to begin the appetite suppressant.  Pt is calling back to say that she does want to start the appetite suppressant.  [facility] [address] [facility] [place]  Reviewed Health History In EMR: No  Reviewed Medications In EMR: No  Reviewed Allergies In EMR: No  Reviewed Surgeries / Procedures: No  Date of Onset of Symptoms: Unknown OB / GYN:  LMP: Unknown  Guideline(s) Used:  No Protocol Available - Information Only  Disposition Per Guideline:   Discuss with PCP and Callback by Nurse Today  Reason For Disposition Reached:   Nursing judgment  Advice Given:  N/A  Patient Will Follow Care Advice:  YES

## 2012-09-15 ENCOUNTER — Encounter: Payer: Self-pay | Admitting: Family

## 2012-09-28 ENCOUNTER — Other Ambulatory Visit: Payer: Self-pay

## 2012-09-28 ENCOUNTER — Other Ambulatory Visit: Payer: Self-pay | Admitting: Family

## 2012-09-28 MED ORDER — DICLOFENAC SODIUM 75 MG PO TBEC: 75.0000 mg | DELAYED_RELEASE_TABLET | Freq: Two times a day (BID) | ORAL | Status: DC

## 2012-10-03 ENCOUNTER — Other Ambulatory Visit: Payer: Self-pay | Admitting: Family

## 2012-10-05 ENCOUNTER — Ambulatory Visit: Payer: BC Managed Care – PPO | Admitting: Family

## 2012-10-05 DIAGNOSIS — Z0289 Encounter for other administrative examinations: Secondary | ICD-10-CM

## 2012-10-14 ENCOUNTER — Telehealth: Payer: Self-pay | Admitting: Family

## 2012-10-14 NOTE — Telephone Encounter (Signed)
Left message to advise pt of need for follow up and denial of Rx without f/u

## 2012-10-14 NOTE — Telephone Encounter (Signed)
[facility] [place] called stating that the patient need a refill of her phentermine 37.5 mg 1poqd in the morning. Please assist.

## 2012-10-19 ENCOUNTER — Ambulatory Visit (INDEPENDENT_AMBULATORY_CARE_PROVIDER_SITE_OTHER): Payer: BC Managed Care – PPO | Admitting: Family

## 2012-10-19 ENCOUNTER — Encounter: Payer: Self-pay | Admitting: Family

## 2012-10-19 VITALS — BP 122/68 | HR 111 | Wt 178.0 lb

## 2012-10-19 DIAGNOSIS — E669 Obesity, unspecified: Secondary | ICD-10-CM

## 2012-10-19 DIAGNOSIS — F3289 Other specified depressive episodes: Secondary | ICD-10-CM

## 2012-10-19 DIAGNOSIS — F329 Major depressive disorder, single episode, unspecified: Secondary | ICD-10-CM

## 2012-10-19 DIAGNOSIS — F32A Depression, unspecified: Secondary | ICD-10-CM

## 2012-10-19 DIAGNOSIS — G4700 Insomnia, unspecified: Secondary | ICD-10-CM

## 2012-10-19 DIAGNOSIS — F411 Generalized anxiety disorder: Secondary | ICD-10-CM

## 2012-10-19 MED ORDER — PHENTERMINE HCL 37.5 MG PO CAPS: 37.5000 mg | ORAL_CAPSULE | ORAL | Status: DC

## 2012-10-19 MED ORDER — DICLOFENAC SODIUM 75 MG PO TBEC: 75.0000 mg | DELAYED_RELEASE_TABLET | Freq: Two times a day (BID) | ORAL | Status: DC

## 2012-10-19 NOTE — Progress Notes (Signed)
Subjective:    Patient ID: Alice Pena, female    DOB: Jul 19, 1960, 52 y.o.   MRN: [id number]  HPI  52 year old white female, nonsmoker is in for recheck of menopausal symptoms, osteoarthritis, and obesity. She's currently is along well on her medications. At her last office visit, she was switched from naproxen to diclofenac which is working extremely well. She was also switched from Zoloft to Effexor 37.5 mg to help combat menopausal symptoms as well as help her mood that is working very effectively. She also started phentermine 37.5 mg and has lost 5 pounds since her last office visit. She reports insomnia associated with the medication but not enough to discontinue it.   Review of Systems  Constitutional: Negative.   HENT: Negative.   Respiratory: Negative.   Cardiovascular: Negative.   Musculoskeletal: Negative.   Skin: Negative.   Allergic/Immunologic: Negative.   Psychiatric/Behavioral: Negative.    Past Medical History  Diagnosis Date  . Arthritis   . Depression     post trauma   . Thyroid disease   . Headache   . Migraines   . Urine incontinence     History   Social History  . Marital Status: Divorced    Spouse Name: N/A    Number of Children: N/A  . Years of Education: N/A   Occupational History  . Not on file.   Social History Main Topics  . Smoking status: Never Smoker   . Smokeless tobacco: Never Used  . Alcohol Use: Yes     Comment: socially   . Drug Use: Not on file  . Sexually Active: Not on file   Other Topics Concern  . Not on file   Social History Narrative  . No narrative on file    Past Surgical History  Procedure Laterality Date  . Cesarean section  1989  . Carpal tunnel release  1995    x2  . Ovarian cyst surgery  2012    behind left ovary   . Breast reduction surgery  1993  . Tonsillectomy and adenoidectomy  1968    Family History  Problem Relation Age of Onset  . Aneurysm Mother     brain  . Healthy Father   . Cancer  Paternal Grandmother     cervical  . Asthma Paternal Grandmother   . Alcohol abuse Paternal Grandfather   . Asthma Paternal Grandfather     Allergies  Allergen Reactions  . Morphine And Related Nausea And Vomiting    Current Outpatient Prescriptions on File Prior to Visit  Medication Sig Dispense Refill  . ALPRAZolam (XANAX) 0.5 MG tablet Take 1 tablet (0.5 mg total) by mouth daily.  30 tablet  2  . ARMOUR THYROID 60 MG tablet TAKE 1 TABLET BY MOUTH EVERY DAY  30 tablet  1  . ARMOUR THYROID 60 MG tablet TAKE 1 TABLET EVERY DAY  30 tablet  1  . ARMOUR THYROID 60 MG tablet TAKE 1 TABLET EVERY DAY  30 tablet  1  . aspirin 81 MG tablet Take 162 mg by mouth daily.       . cholecalciferol (VITAMIN D) 1000 UNITS tablet Take 1,000 Units by mouth daily.      . fish oil-omega-3 fatty acids 1000 MG capsule Take 2 g by mouth daily.      Marland Kitchen levothyroxine (SYNTHROID, LEVOTHROID) 75 MCG tablet Take 75 mcg by mouth daily.      . Multiple Vitamin (MULTIVITAMIN) tablet Take 1 tablet by mouth  daily.      Marland Kitchen OVER THE COUNTER MEDICATION Power C x 2 (gummy)      . traMADol (ULTRAM) 50 MG tablet TAKE 1 TABLET EVERY 8 HOURS AS NEEDED PAIN  30 tablet  0  . traMADol (ULTRAM) 50 MG tablet TAKE 1 TABLET EVERY 8 HOURS AS NEEDED PAIN  30 tablet  0  . venlafaxine XR (EFFEXOR XR) 37.5 MG 24 hr capsule Take 1 capsule (37.5 mg total) by mouth daily.  30 capsule  1  . vitamin E 400 UNIT capsule Take 400 Units by mouth 2 (two) times daily.      Marland Kitchen zolpidem (AMBIEN) 10 MG tablet TAKE 1 TAB BY MOUTH AT BEDTIME AS NEEDED FOR SLEEP. DONT TAKE EVERY NIGHT: MAY BECOME HABIT FORMING  30 tablet  1  . zolpidem (AMBIEN) 10 MG tablet TAKE 1 TABLET AT BEDTIME AS NEEDED SLEEP  30 tablet  1  . zolpidem (AMBIEN) 10 MG tablet Take 1 tablet (10 mg total) by mouth at bedtime as needed for sleep.  30 tablet  3  . zolpidem (AMBIEN) 10 MG tablet Take 1 tablet (10 mg total) by mouth at bedtime as needed for sleep. DO NOT TAKE EVERY NIGHT AS IT  CAN BECOME HABIT FORMING  30 tablet  1   No current facility-administered medications on file prior to visit.    BP 122/68  Pulse 111  Wt 178 lb (80.74 kg)  BMI 34.76 kg/m2  SpO2 98%chart    Objective:   Physical Exam  Constitutional: She is oriented to person, place, and time. She appears well-developed and well-nourished.  Neck: Normal range of motion. Neck supple. No thyromegaly present.  Cardiovascular: Normal rate, regular rhythm and normal heart sounds.   Pulmonary/Chest: Effort normal and breath sounds normal.  Neurological: She is alert and oriented to person, place, and time.  Skin: Skin is warm and dry.  Psychiatric: She has a normal mood and affect.          Assessment & Plan:  Assessment: 1. Obesity 2. Menopausal symptoms 3. Depression 4. Hypothyroidism 5. Osteoarthritis  Plan: Continue current medications. Antihistamine at night as needed for insomnia. Recheck weight in 4 weeks and sooner as needed.

## 2012-10-19 NOTE — Patient Instructions (Addendum)

## 2012-11-04 ENCOUNTER — Other Ambulatory Visit: Payer: Self-pay

## 2012-11-04 MED ORDER — VENLAFAXINE HCL ER 37.5 MG PO CP24: 37.5000 mg | ORAL_CAPSULE | Freq: Every day | ORAL | Status: DC

## 2012-11-10 ENCOUNTER — Other Ambulatory Visit: Payer: Self-pay | Admitting: Family

## 2012-11-16 ENCOUNTER — Telehealth: Payer: Self-pay | Admitting: Family

## 2012-11-16 MED ORDER — ZOLPIDEM TARTRATE 10 MG PO TABS: 10.0000 mg | ORAL_TABLET | Freq: Every evening | ORAL | Status: DC | PRN

## 2012-11-16 NOTE — Telephone Encounter (Signed)
Pt needs refill of zolpidem (AMBIEN) 10 MG tablet. (Pt called pharm last week for this, only tramodal was submitted) [facility] [place] [place] [facility])

## 2012-11-16 NOTE — Telephone Encounter (Signed)
Last office visit 10/19/12. Is it okay to refill medication?

## 2012-11-16 NOTE — Telephone Encounter (Signed)
I have sent to pharmacy.

## 2012-11-18 NOTE — Telephone Encounter (Signed)
Patient stated that the pharmacy still has not received this RX. When looking at the pt's medication list, it looks like this RX was "printed". Please advise.

## 2012-11-20 NOTE — Telephone Encounter (Signed)
Rx for Ambien called in to pharmacy.  Advised that if pt had picked up rx do not refill medication.  It seems rx was printed by accident?

## 2012-12-28 ENCOUNTER — Other Ambulatory Visit: Payer: Self-pay

## 2012-12-28 MED ORDER — THYROID 60 MG PO TABS: ORAL_TABLET | ORAL | Status: DC

## 2013-02-26 ENCOUNTER — Other Ambulatory Visit: Payer: Self-pay | Admitting: Family

## 2013-03-08 ENCOUNTER — Other Ambulatory Visit: Payer: Self-pay

## 2013-03-08 MED ORDER — ZOLPIDEM TARTRATE 10 MG PO TABS: ORAL_TABLET | ORAL | Status: DC

## 2013-03-28 ENCOUNTER — Other Ambulatory Visit: Payer: Self-pay | Admitting: Family

## 2013-04-23 ENCOUNTER — Ambulatory Visit: Payer: BC Managed Care – PPO | Admitting: Family

## 2013-05-06 ENCOUNTER — Other Ambulatory Visit: Payer: Self-pay

## 2013-05-08 ENCOUNTER — Other Ambulatory Visit: Payer: Self-pay | Admitting: Family

## 2013-05-22 ENCOUNTER — Other Ambulatory Visit: Payer: Self-pay | Admitting: Family

## 2013-05-26 ENCOUNTER — Telehealth: Payer: Self-pay | Admitting: Family

## 2013-05-26 NOTE — Telephone Encounter (Signed)
Pt needs f/u

## 2013-05-26 NOTE — Telephone Encounter (Signed)
Pt stated they will be changing INS again and she must find a new provider. Pt decline appt

## 2013-05-26 NOTE — Telephone Encounter (Signed)
lmom for pt to cb

## 2013-05-26 NOTE — Telephone Encounter (Signed)
Pt needs refill on ambien 10 mg

## 2013-06-03 ENCOUNTER — Telehealth: Payer: Self-pay

## 2013-06-03 NOTE — Telephone Encounter (Signed)
Last filled 03/08/13 for #30 with 1 refill. Last office visit 09/2012

## 2013-06-04 NOTE — Telephone Encounter (Signed)
Needs OV.  

## 2013-06-04 NOTE — Telephone Encounter (Signed)
lmom at [phone] for pt to callback

## 2013-06-04 NOTE — Telephone Encounter (Signed)
Please call pt to schedule OV 

## 2013-06-04 NOTE — Telephone Encounter (Signed)
Pt states she has winged herself off on [place]

## 2013-07-25 ENCOUNTER — Other Ambulatory Visit: Payer: Self-pay | Admitting: Family

## 2013-08-07 ENCOUNTER — Other Ambulatory Visit: Payer: Self-pay | Admitting: Family

## 2013-08-09 ENCOUNTER — Telehealth: Payer: Self-pay | Admitting: Family

## 2013-08-09 NOTE — Telephone Encounter (Signed)
Pt is requesting new rx for ARMOUR THYROID 60 MG tablet [facility] [address] [place],pt is completely out of meds

## 2013-08-09 NOTE — Telephone Encounter (Signed)
Pt needs appointment

## 2013-08-10 NOTE — Telephone Encounter (Signed)
Left msg for pt to call and schedule appt

## 2015-03-09 ENCOUNTER — Ambulatory Visit (INDEPENDENT_AMBULATORY_CARE_PROVIDER_SITE_OTHER): Payer: BLUE CROSS/BLUE SHIELD | Admitting: Podiatry

## 2015-03-09 ENCOUNTER — Ambulatory Visit (INDEPENDENT_AMBULATORY_CARE_PROVIDER_SITE_OTHER): Payer: BLUE CROSS/BLUE SHIELD

## 2015-03-09 ENCOUNTER — Encounter: Payer: Self-pay | Admitting: Podiatry

## 2015-03-09 VITALS — BP 125/78 | HR 67 | Resp 16 | Ht 60.0 in | Wt 165.0 lb

## 2015-03-09 DIAGNOSIS — M722 Plantar fascial fibromatosis: Secondary | ICD-10-CM | POA: Diagnosis not present

## 2015-03-09 DIAGNOSIS — M79673 Pain in unspecified foot: Secondary | ICD-10-CM

## 2015-03-09 DIAGNOSIS — M2012 Hallux valgus (acquired), left foot: Secondary | ICD-10-CM | POA: Diagnosis not present

## 2015-03-09 DIAGNOSIS — M21612 Bunion of left foot: Secondary | ICD-10-CM

## 2015-03-09 MED ORDER — TRIAMCINOLONE ACETONIDE 10 MG/ML IJ SUSP
10.0000 mg | Freq: Once | INTRAMUSCULAR | Status: AC
  Administered 2015-03-09: 10 mg

## 2015-03-09 NOTE — Progress Notes (Signed)
   Subjective:    Patient ID: Alice Pena, female    DOB: 02-02-1961, 54 y.o.   MRN: [id number]  HPI Patient presents with foot pain in their left foot, heel. Has a bone spur in heel. Pt has had 2 rounds of steroid shots. Saw Dr. Humphrey Rolls at [facility] April-May 2016.  Pt stated, "steroid shots did not help with the pain".   Review of Systems  Musculoskeletal: Positive for myalgias, arthralgias and gait problem.  Neurological: Positive for numbness.  All other systems reviewed and are negative.      Objective:   Physical Exam        Assessment & Plan:

## 2015-03-09 NOTE — Patient Instructions (Signed)

## 2015-03-10 NOTE — Progress Notes (Signed)
Subjective:     Patient ID: Alice Pena, female   DOB: Nov 16, 1960, 54 y.o.   MRN: [id number]  HPI patient has had long-term history of plantar fasciitis left which got better when she was undergoing chemotherapy and radiation and has worsened in intensity over the last 6 months. Patient states she's needed other treatments and at the same time did have 2 cortisone injections administered by anesthesia which have not proven to be effective   Review of Systems  All other systems reviewed and are negative.      Objective:   Physical Exam  Constitutional: She is oriented to person, place, and time.  Cardiovascular: Intact distal pulses.   Musculoskeletal: Normal range of motion.  Neurological: She is oriented to person, place, and time.  Skin: Skin is warm.  Nursing note and vitals reviewed.  neurovascular status intact with muscle strength adequate and range of motion within normal limits. I noted patient to have exquisite discomfort in the plantar center and lateral aspect of the fascia at the insertion into the calcaneus and mild discomfort also in the posterior heel. Patient has good digital perfusion I noted her to be well oriented 3 with mild equinus condition     Assessment:     Plantar fasciitis more in the center and lateral band which may be compensatory in nature    Plan:     H&P and x-rays reviewed with patient. Today I went ahead and I did a injection from the lateral side 3 mg Kenalog 5 mg Xylocaine and I went ahead and I applied fascial brace. Discussed long-term orthotics but first we need to get her out for acute pain. Reappoint to recheck and I also because of intense morning discomfort dispensed night splint with instructions on usage

## 2015-06-01 ENCOUNTER — Ambulatory Visit (INDEPENDENT_AMBULATORY_CARE_PROVIDER_SITE_OTHER): Payer: BLUE CROSS/BLUE SHIELD | Admitting: Podiatry

## 2015-06-01 ENCOUNTER — Encounter: Payer: Self-pay | Admitting: Podiatry

## 2015-06-01 VITALS — BP 112/72 | HR 73 | Resp 16

## 2015-06-01 DIAGNOSIS — M722 Plantar fascial fibromatosis: Secondary | ICD-10-CM

## 2015-06-01 DIAGNOSIS — M779 Enthesopathy, unspecified: Secondary | ICD-10-CM | POA: Diagnosis not present

## 2015-06-01 NOTE — Progress Notes (Signed)
Subjective:     Patient ID: Alice Pena, female   DOB: Feb 07, 1961, 54 y.o.   MRN: [id number]  HPI patient states that my left heel has been very tender and did not respond to the medication the stretching wore the brace that we put on   Review of Systems     Objective:   Physical Exam Neurovascular status intact muscle strength adequate range of motion within normal limits with patient noted to have exquisite discomfort on the lateral plantar side of the left heel into the Achilles tendon and into the forefoot on the medial and lateral side and into the arch.    Assessment:     Chronic fasciitis-like symptoms with heel pain arch pain and Achilles tendinitis    Plan:     Reviewed condition at great length and due to the numerous points of pain I recommended long-term orthotics and I scanned for custom orthotic devices and I did go ahead today and I placed an air fracture walker to completely immobilize. Patient will be seen back for [place] to recheck when orthotics are returned

## 2015-06-26 ENCOUNTER — Telehealth: Payer: Self-pay | Admitting: *Deleted

## 2015-06-26 ENCOUNTER — Ambulatory Visit: Payer: BLUE CROSS/BLUE SHIELD | Admitting: Podiatry

## 2015-06-26 NOTE — Telephone Encounter (Signed)
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  Pt states she has called out Wednesday and Thursday of this week, but would like to know what to do.  I told pt to remain in the boot and come in to be seen by Dr. Paulla Dolly this week.  Pt was transferred to schedulers.

## 2015-06-30 ENCOUNTER — Telehealth: Payer: Self-pay | Admitting: *Deleted

## 2015-06-30 ENCOUNTER — Encounter: Payer: Self-pay | Admitting: Podiatry

## 2015-06-30 ENCOUNTER — Ambulatory Visit (INDEPENDENT_AMBULATORY_CARE_PROVIDER_SITE_OTHER): Payer: BLUE CROSS/BLUE SHIELD | Admitting: Podiatry

## 2015-06-30 VITALS — BP 151/89 | HR 62 | Resp 16

## 2015-06-30 DIAGNOSIS — M722 Plantar fascial fibromatosis: Secondary | ICD-10-CM

## 2015-06-30 MED ORDER — TRAMADOL HCL 50 MG PO TABS: 50.0000 mg | ORAL_TABLET | Freq: Three times a day (TID) | ORAL | Status: DC

## 2015-06-30 NOTE — Telephone Encounter (Signed)
Pt asked if she was to carry a rx for Tramadol to the pharmacy.  I [facility] [place]

## 2015-06-30 NOTE — Telephone Encounter (Signed)
Tramadol rx called to [place].

## 2015-06-30 NOTE — Progress Notes (Signed)
Subjective:     Patient ID: Alice Pena, female   DOB: June 05, 1961, 54 y.o.   MRN: [id number]  HPI patient presents stating this heel is still really bothering me and I know I have had radiation and chemotherapy but I'm having trouble walking on it and the boot only helped me a little bit   Review of Systems     Objective:   Physical Exam Neurovascular status unchanged with discomfort in the medial central and lateral band of the plantar fascial left    Assessment:     Continue plantar fasciitis with possibility for other issues associated with previous radiation chemotherapy    Plan:     Reviewed all conditions and at this time I recommended shockwave explaining there is no guarantee that this will solve the problem. Patient is scheduled for shockwave and will be seen back

## 2015-06-30 NOTE — Patient Instructions (Signed)

## 2015-07-12 ENCOUNTER — Telehealth: Payer: Self-pay | Admitting: *Deleted

## 2015-07-12 MED ORDER — MELOXICAM 15 MG PO TABS: 15.0000 mg | ORAL_TABLET | Freq: Every day | ORAL | Status: DC

## 2015-07-12 NOTE — Telephone Encounter (Addendum)
Pt states she is still having foot pain, but the Tramadol is not helping and makes her loopy, pt request an antiinflammatory medication.  Dr. Paulla Dolly ordered Mobic 15mg  #30 one tablet daily with food, +1refill. [facility]

## 2015-07-17 ENCOUNTER — Telehealth: Payer: Self-pay | Admitting: Podiatry

## 2015-07-17 NOTE — Telephone Encounter (Signed)
Left message for pt. To call office to schedule appt

## 2015-07-17 NOTE — Telephone Encounter (Signed)
Pt called states she is scheduled for EPAT on Wednesday, and would like to know if there is any down time from work.  Pt states she is at work currently and to leave a message.  I left message stating there was no down time from work, and to bring the cam walker boot to the first visit and she would be given instructions.

## 2015-07-19 ENCOUNTER — Ambulatory Visit (INDEPENDENT_AMBULATORY_CARE_PROVIDER_SITE_OTHER): Payer: BLUE CROSS/BLUE SHIELD | Admitting: Podiatry

## 2015-07-19 DIAGNOSIS — M722 Plantar fascial fibromatosis: Secondary | ICD-10-CM

## 2015-07-19 NOTE — Progress Notes (Signed)
Subjective:     Patient ID: Alice Pena, female   DOB: 1960/12/17, 55 y.o.   MRN: [id number]  HPI patient presents for shockwave left   Review of Systems     Objective:   Physical Exam Neurovascular status intact with intense discomfort plantar medial aspect left heel    Assessment:     Plantar fasciitis left which also may be part of her chemotherapy process    Plan:     Shockwave administered at one 0.83 thousand shocks 16 frequency and reappoint in 1 week

## 2015-07-28 ENCOUNTER — Ambulatory Visit (INDEPENDENT_AMBULATORY_CARE_PROVIDER_SITE_OTHER): Payer: BLUE CROSS/BLUE SHIELD

## 2015-07-28 DIAGNOSIS — M722 Plantar fascial fibromatosis: Secondary | ICD-10-CM

## 2015-07-28 NOTE — Progress Notes (Signed)
Subjective:     Patient ID: Alice Pena, female   DOB: April 29, 1961, 55 y.o.   MRN: [id number]  HPI patient presents for shockwave left   Review of Systems    Pt presents stating that her foot feels "amazing". Pain has improved a lot, she is able to walk and put pressure on her foot Objective:   Physical Exam Neurovascular status intact with intense discomfort plantar medial aspect left heel    Assessment:     Plantar fasciitis left which also may be part of her chemotherapy process    Plan:     Shockwave administered at 2.2 3000 thousand shocks 16 frequency and reappoint in 1 week

## 2015-08-03 ENCOUNTER — Ambulatory Visit (INDEPENDENT_AMBULATORY_CARE_PROVIDER_SITE_OTHER): Payer: BLUE CROSS/BLUE SHIELD

## 2015-08-03 DIAGNOSIS — M722 Plantar fascial fibromatosis: Secondary | ICD-10-CM

## 2015-08-03 NOTE — Progress Notes (Signed)
   Subjective:    Patient ID: Alice Pena, female    DOB: 1961-02-11, 55 y.o.   MRN: [id number]  HPI Pt presents stating the bottom of my left foot feels better but now im having pain on the top of my left foot after prolonged walking and standing   Review of Systems    All other systems negative Objective:   Physical Exam  Improving plantar fascial pain      Assessment & Plan:  Shockwave therapy performed at 17hz , 3.0, 3000 pulses delivered. Pt tolerated procedure well. Advised her to wear her foot to help relieve dorsal foot pain. She was re-appointed to follow up with Dr Paulla Dolly for re-evaluation and 4th EPAT treatment in 1 month

## 2015-08-22 ENCOUNTER — Ambulatory Visit
Admission: RE | Admit: 2015-08-22 | Discharge: 2015-08-22 | Disposition: A | Discharge status: Home / Self Care | Payer: BLUE CROSS/BLUE SHIELD | Source: Ambulatory Visit | Attending: Anesthesiology | Admitting: Anesthesiology

## 2015-08-22 ENCOUNTER — Other Ambulatory Visit: Payer: Self-pay | Admitting: Anesthesiology

## 2015-08-22 DIAGNOSIS — M25552 Pain in left hip: Principal | ICD-10-CM

## 2015-08-22 DIAGNOSIS — M25551 Pain in right hip: Secondary | ICD-10-CM

## 2015-08-31 ENCOUNTER — Other Ambulatory Visit: Payer: BLUE CROSS/BLUE SHIELD

## 2015-09-04 ENCOUNTER — Ambulatory Visit (INDEPENDENT_AMBULATORY_CARE_PROVIDER_SITE_OTHER): Payer: BLUE CROSS/BLUE SHIELD | Admitting: Podiatry

## 2015-09-04 ENCOUNTER — Encounter: Payer: Self-pay | Admitting: Podiatry

## 2015-09-04 DIAGNOSIS — M79673 Pain in unspecified foot: Secondary | ICD-10-CM

## 2015-09-04 DIAGNOSIS — M779 Enthesopathy, unspecified: Secondary | ICD-10-CM | POA: Diagnosis not present

## 2015-09-04 DIAGNOSIS — M722 Plantar fascial fibromatosis: Secondary | ICD-10-CM | POA: Diagnosis not present

## 2015-09-04 MED ORDER — TRIAMCINOLONE ACETONIDE 10 MG/ML IJ SUSP
10.0000 mg | Freq: Once | INTRAMUSCULAR | Status: AC
  Administered 2015-09-04: 10 mg

## 2015-09-06 NOTE — Progress Notes (Signed)
Subjective:     Patient ID: Alice Pena, female   DOB: Nov 14, 1960, 55 y.o.   MRN: [id number]  HPI patient states quite a bit of the heel pain has gone away but I am getting some pain in the outside of my left foot as I'm starting to walk better   Review of Systems     Objective:   Physical Exam Neurovascular status intact with diminishment of discomfort plantar aspect left heel at the insertional point with inflammation pain at the peroneal insertion left    Assessment:     Plantar fasciitis left improved with tendinitis of the left peroneal which is probably compensatory in nature    Plan:     Due to acute nature of pain I did go ahead and did a careful peroneal injection 3 mg Kenalog 5 mg Xylocaine and instructed on physical therapy ice therapy and reappoint to recheck

## 2015-10-02 ENCOUNTER — Ambulatory Visit (INDEPENDENT_AMBULATORY_CARE_PROVIDER_SITE_OTHER): Payer: BLUE CROSS/BLUE SHIELD | Admitting: Podiatry

## 2015-10-02 ENCOUNTER — Encounter: Payer: Self-pay | Admitting: Podiatry

## 2015-10-02 DIAGNOSIS — M722 Plantar fascial fibromatosis: Secondary | ICD-10-CM | POA: Diagnosis not present

## 2015-10-02 DIAGNOSIS — M779 Enthesopathy, unspecified: Secondary | ICD-10-CM | POA: Diagnosis not present

## 2015-10-02 MED ORDER — TRIAMCINOLONE ACETONIDE 10 MG/ML IJ SUSP
10.0000 mg | Freq: Once | INTRAMUSCULAR | Status: AC
  Administered 2015-10-02: 10 mg

## 2015-10-03 NOTE — Progress Notes (Signed)
Subjective:     Patient ID: Alice Pena, female   DOB: 31-Jan-1961, 55 y.o.   MRN: [id number]  HPI patient presents stating I'm still having some problems with my foot with my heel doing reasonably well but having pain on the inside of the ankle   Review of Systems     Objective:   Physical Exam Neurovascular status intact with moderate depression of the arch and inflammation around the posterior tibial tendon left at its insertion into the navicular    Assessment:     Tendinitis left with inflammation of posterior tib with plantar fasciitis that's doing reasonably well secondary to shockwave other treatments    Plan:     H&P x-rays reviewed and today I did a sheath injection left 3 mg Kenalog 5 mill grams Xylocaine and advised on reduced activity and utilization of boot. Patient be seen back to recheck in approximately 3 weeks and will wear orthotics when not wearing boot  X-ray does indicate depression of the arch with no indications of collapse

## 2015-10-16 ENCOUNTER — Ambulatory Visit: Payer: BLUE CROSS/BLUE SHIELD | Admitting: Podiatry

## 2016-03-23 ENCOUNTER — Other Ambulatory Visit: Payer: Self-pay | Admitting: Podiatry

## 2016-08-27 DIAGNOSIS — J00 Acute nasopharyngitis [common cold]: Secondary | ICD-10-CM | POA: Diagnosis not present

## 2016-10-14 DIAGNOSIS — F1120 Opioid dependence, uncomplicated: Secondary | ICD-10-CM | POA: Diagnosis not present

## 2016-10-14 DIAGNOSIS — G894 Chronic pain syndrome: Secondary | ICD-10-CM | POA: Diagnosis not present

## 2016-10-14 DIAGNOSIS — G893 Neoplasm related pain (acute) (chronic): Secondary | ICD-10-CM | POA: Diagnosis not present

## 2016-10-14 DIAGNOSIS — G8929 Other chronic pain: Secondary | ICD-10-CM | POA: Diagnosis not present

## 2017-01-10 DIAGNOSIS — Z1389 Encounter for screening for other disorder: Secondary | ICD-10-CM | POA: Diagnosis not present

## 2017-01-10 DIAGNOSIS — T451X5A Adverse effect of antineoplastic and immunosuppressive drugs, initial encounter: Secondary | ICD-10-CM | POA: Diagnosis not present

## 2017-01-10 DIAGNOSIS — E039 Hypothyroidism, unspecified: Secondary | ICD-10-CM | POA: Diagnosis not present

## 2017-01-10 DIAGNOSIS — G620 Drug-induced polyneuropathy: Secondary | ICD-10-CM | POA: Diagnosis not present

## 2017-01-10 DIAGNOSIS — Z0000 Encounter for general adult medical examination without abnormal findings: Secondary | ICD-10-CM | POA: Diagnosis not present

## 2017-01-15 IMAGING — CR DG HIP (WITH OR WITHOUT PELVIS) 5+V BILAT
4 series · 4 of 4 positions shown · non-contrast
Comparison: None.

CLINICAL DATA: Bilateral hip pain for 1 year, right greater than
left.

EXAM:
DG HIP (WITH OR WITHOUT PELVIS) 5+V BILAT

[w hip ap left]
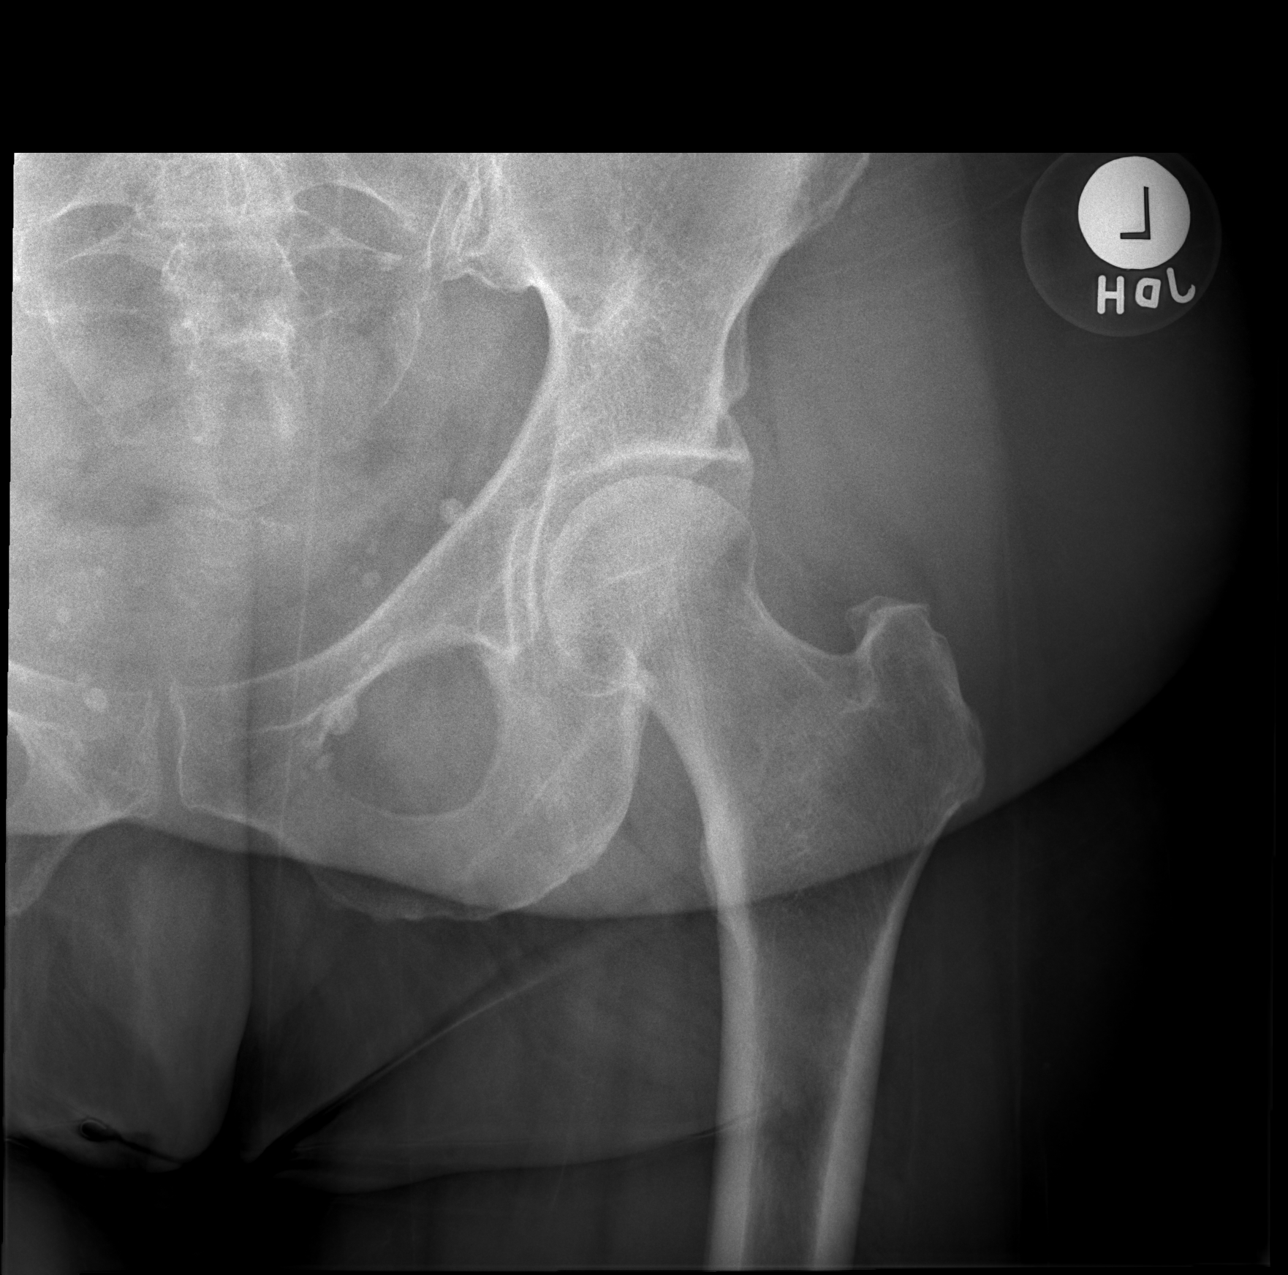

[w hip frog left]
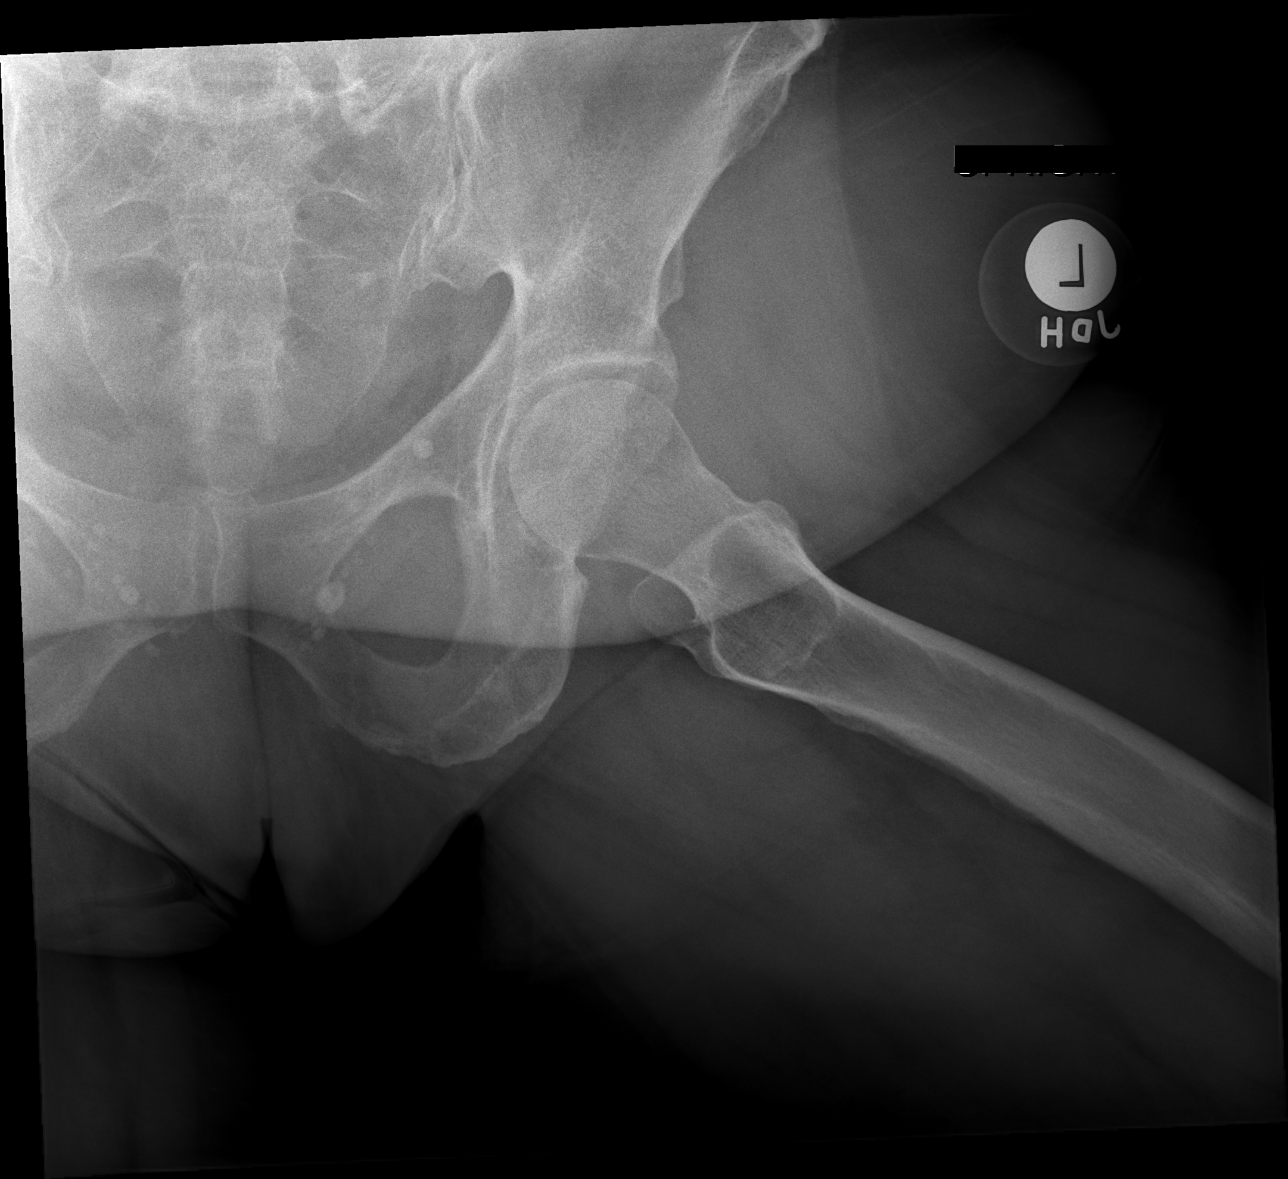

[w hip ap right (1 of 2)]
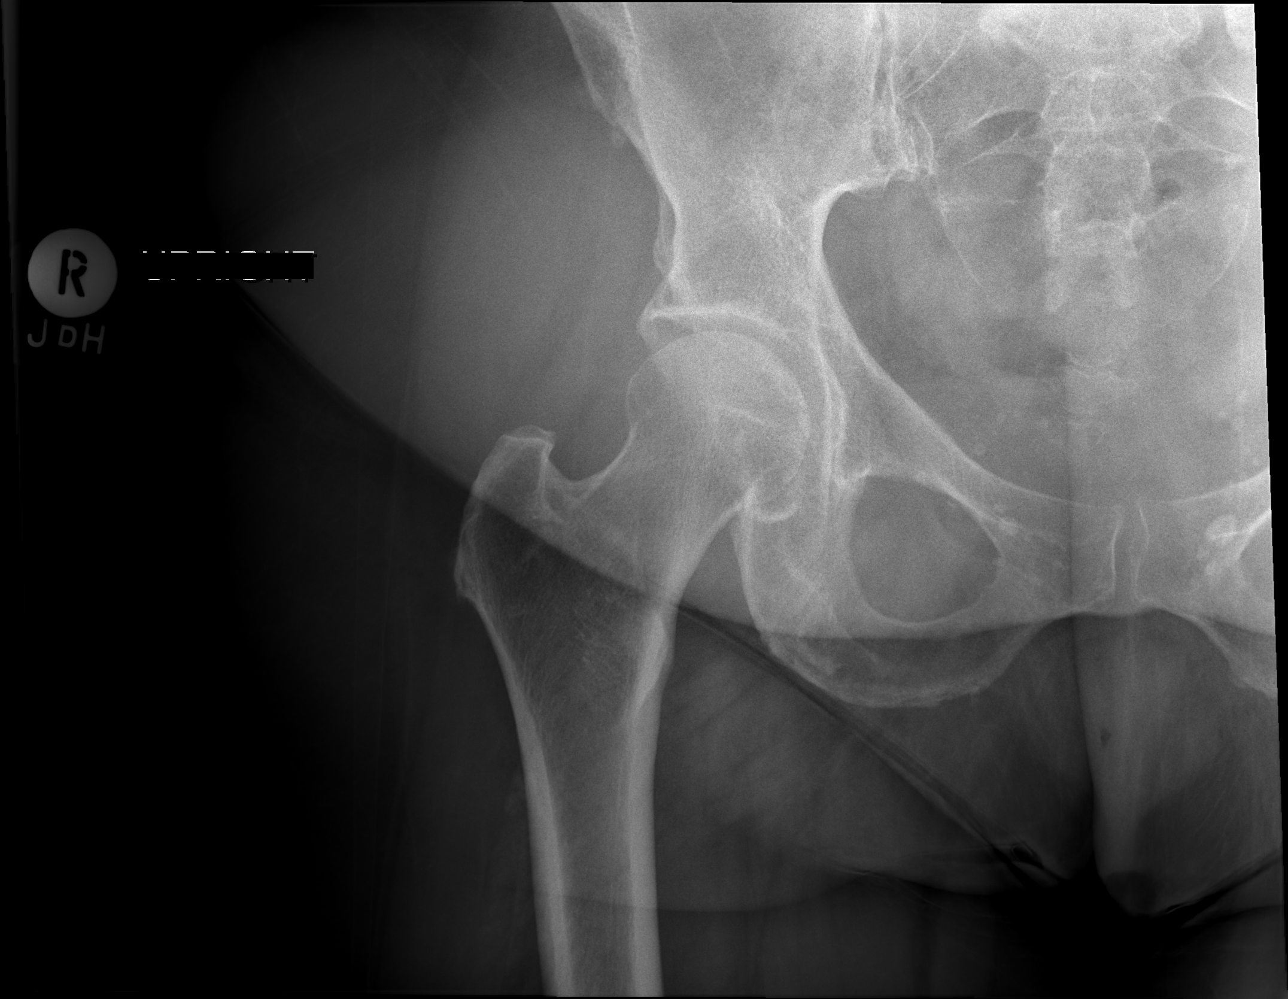

[w hip ap right (2 of 2)]
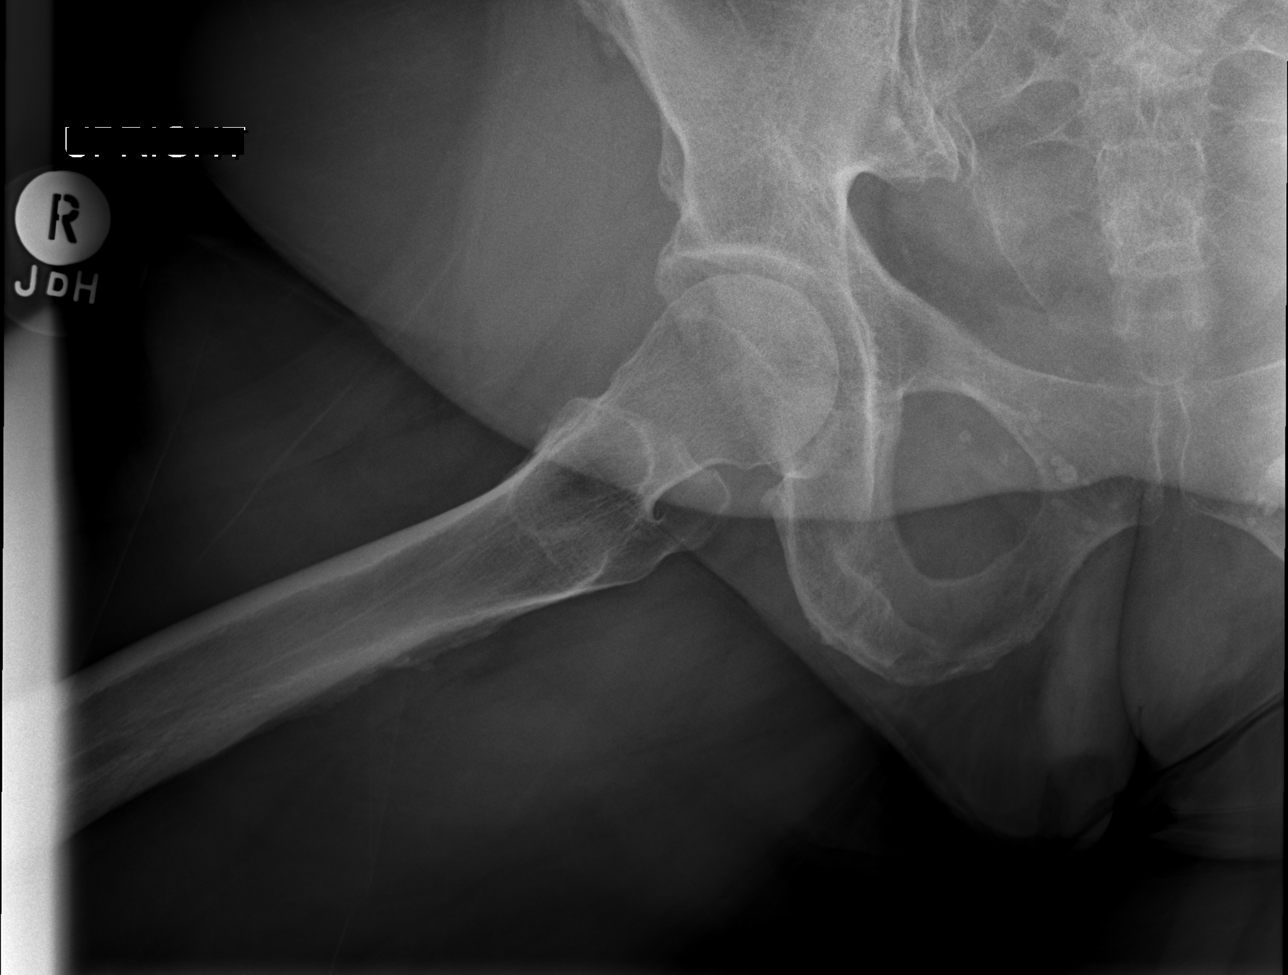

[4 of 4 positions shown; findings below may reference images not displayed]

FINDINGS: AP and frog-leg lateral views of the left hip show no evidence of
fracture. No worrisome lytic or sclerotic osseous abnormality. Joint
space in the hip is preserved.

AP and frog-leg lateral views of the right hip show no evidence for
acute fracture. No worrisome lytic or sclerotic osseous abnormality.
Joint space in the hip is preserved.
IMPRESSION: Negative.

## 2017-04-14 DIAGNOSIS — M722 Plantar fascial fibromatosis: Secondary | ICD-10-CM | POA: Diagnosis not present

## 2017-04-14 DIAGNOSIS — T451X5A Adverse effect of antineoplastic and immunosuppressive drugs, initial encounter: Secondary | ICD-10-CM | POA: Diagnosis not present

## 2017-04-14 DIAGNOSIS — G620 Drug-induced polyneuropathy: Secondary | ICD-10-CM | POA: Diagnosis not present

## 2017-04-14 DIAGNOSIS — G894 Chronic pain syndrome: Secondary | ICD-10-CM | POA: Diagnosis not present

## 2017-04-14 DIAGNOSIS — F1120 Opioid dependence, uncomplicated: Secondary | ICD-10-CM | POA: Diagnosis not present

## 2017-04-14 DIAGNOSIS — G893 Neoplasm related pain (acute) (chronic): Secondary | ICD-10-CM | POA: Diagnosis not present

## 2017-04-16 ENCOUNTER — Other Ambulatory Visit: Payer: Self-pay | Admitting: Anesthesiology

## 2017-04-16 DIAGNOSIS — M545 Low back pain: Secondary | ICD-10-CM

## 2017-04-28 ENCOUNTER — Other Ambulatory Visit: Payer: BLUE CROSS/BLUE SHIELD

## 2017-04-28 ENCOUNTER — Ambulatory Visit (INDEPENDENT_AMBULATORY_CARE_PROVIDER_SITE_OTHER): Payer: BLUE CROSS/BLUE SHIELD

## 2017-04-28 DIAGNOSIS — M545 Low back pain: Secondary | ICD-10-CM | POA: Diagnosis not present

## 2017-06-12 DIAGNOSIS — Z08 Encounter for follow-up examination after completed treatment for malignant neoplasm: Secondary | ICD-10-CM | POA: Diagnosis not present

## 2017-06-12 DIAGNOSIS — Z85038 Personal history of other malignant neoplasm of large intestine: Secondary | ICD-10-CM | POA: Diagnosis not present

## 2017-06-12 DIAGNOSIS — R599 Enlarged lymph nodes, unspecified: Secondary | ICD-10-CM | POA: Diagnosis not present

## 2017-06-13 DIAGNOSIS — R599 Enlarged lymph nodes, unspecified: Secondary | ICD-10-CM | POA: Diagnosis not present

## 2017-06-13 DIAGNOSIS — R918 Other nonspecific abnormal finding of lung field: Secondary | ICD-10-CM | POA: Diagnosis not present

## 2017-06-13 DIAGNOSIS — R590 Localized enlarged lymph nodes: Secondary | ICD-10-CM | POA: Diagnosis not present

## 2017-06-13 DIAGNOSIS — Z85038 Personal history of other malignant neoplasm of large intestine: Secondary | ICD-10-CM | POA: Diagnosis not present

## 2017-06-18 DIAGNOSIS — T8149XA Infection following a procedure, other surgical site, initial encounter: Secondary | ICD-10-CM | POA: Diagnosis not present

## 2017-06-18 DIAGNOSIS — G620 Drug-induced polyneuropathy: Secondary | ICD-10-CM | POA: Diagnosis not present

## 2017-06-18 DIAGNOSIS — T451X5S Adverse effect of antineoplastic and immunosuppressive drugs, sequela: Secondary | ICD-10-CM | POA: Diagnosis not present

## 2017-06-18 DIAGNOSIS — Z08 Encounter for follow-up examination after completed treatment for malignant neoplasm: Secondary | ICD-10-CM | POA: Diagnosis not present

## 2017-06-18 DIAGNOSIS — C4492 Squamous cell carcinoma of skin, unspecified: Secondary | ICD-10-CM | POA: Diagnosis not present

## 2017-06-18 DIAGNOSIS — K5901 Slow transit constipation: Secondary | ICD-10-CM | POA: Diagnosis not present

## 2017-06-18 DIAGNOSIS — R591 Generalized enlarged lymph nodes: Secondary | ICD-10-CM | POA: Diagnosis not present

## 2017-06-18 DIAGNOSIS — Z9221 Personal history of antineoplastic chemotherapy: Secondary | ICD-10-CM | POA: Diagnosis not present

## 2017-06-18 DIAGNOSIS — C20 Malignant neoplasm of rectum: Secondary | ICD-10-CM | POA: Diagnosis not present

## 2017-06-18 DIAGNOSIS — F329 Major depressive disorder, single episode, unspecified: Secondary | ICD-10-CM | POA: Diagnosis not present

## 2017-06-18 DIAGNOSIS — E559 Vitamin D deficiency, unspecified: Secondary | ICD-10-CM | POA: Diagnosis not present

## 2017-06-19 DIAGNOSIS — R591 Generalized enlarged lymph nodes: Secondary | ICD-10-CM | POA: Diagnosis not present

## 2017-06-19 DIAGNOSIS — C211 Malignant neoplasm of anal canal: Secondary | ICD-10-CM | POA: Diagnosis not present

## 2017-06-19 DIAGNOSIS — F329 Major depressive disorder, single episode, unspecified: Secondary | ICD-10-CM | POA: Diagnosis not present

## 2017-06-19 DIAGNOSIS — Z1231 Encounter for screening mammogram for malignant neoplasm of breast: Secondary | ICD-10-CM | POA: Diagnosis not present

## 2017-06-25 DIAGNOSIS — Z85048 Personal history of other malignant neoplasm of rectum, rectosigmoid junction, and anus: Secondary | ICD-10-CM | POA: Diagnosis not present

## 2017-06-25 DIAGNOSIS — C770 Secondary and unspecified malignant neoplasm of lymph nodes of head, face and neck: Secondary | ICD-10-CM | POA: Diagnosis not present

## 2017-06-25 DIAGNOSIS — E669 Obesity, unspecified: Secondary | ICD-10-CM | POA: Diagnosis not present

## 2017-06-25 DIAGNOSIS — E785 Hyperlipidemia, unspecified: Secondary | ICD-10-CM | POA: Diagnosis not present

## 2017-06-25 DIAGNOSIS — Z79891 Long term (current) use of opiate analgesic: Secondary | ICD-10-CM | POA: Diagnosis not present

## 2017-06-25 DIAGNOSIS — C779 Secondary and unspecified malignant neoplasm of lymph node, unspecified: Secondary | ICD-10-CM | POA: Diagnosis not present

## 2017-06-25 DIAGNOSIS — R590 Localized enlarged lymph nodes: Secondary | ICD-10-CM | POA: Diagnosis not present

## 2017-06-25 DIAGNOSIS — G629 Polyneuropathy, unspecified: Secondary | ICD-10-CM | POA: Diagnosis not present

## 2017-06-25 DIAGNOSIS — Z885 Allergy status to narcotic agent status: Secondary | ICD-10-CM | POA: Diagnosis not present

## 2017-06-25 DIAGNOSIS — R591 Generalized enlarged lymph nodes: Secondary | ICD-10-CM | POA: Diagnosis not present

## 2017-06-25 DIAGNOSIS — Z6833 Body mass index (BMI) 33.0-33.9, adult: Secondary | ICD-10-CM | POA: Diagnosis not present

## 2017-06-25 DIAGNOSIS — E039 Hypothyroidism, unspecified: Secondary | ICD-10-CM | POA: Diagnosis not present

## 2017-06-25 DIAGNOSIS — C801 Malignant (primary) neoplasm, unspecified: Secondary | ICD-10-CM | POA: Diagnosis not present

## 2017-06-25 DIAGNOSIS — Z79899 Other long term (current) drug therapy: Secondary | ICD-10-CM | POA: Diagnosis not present

## 2017-06-27 DIAGNOSIS — T8149XA Infection following a procedure, other surgical site, initial encounter: Secondary | ICD-10-CM | POA: Diagnosis not present

## 2017-06-27 DIAGNOSIS — Z9221 Personal history of antineoplastic chemotherapy: Secondary | ICD-10-CM | POA: Diagnosis not present

## 2017-06-27 DIAGNOSIS — K5901 Slow transit constipation: Secondary | ICD-10-CM | POA: Diagnosis not present

## 2017-06-27 DIAGNOSIS — F329 Major depressive disorder, single episode, unspecified: Secondary | ICD-10-CM | POA: Diagnosis not present

## 2017-06-27 DIAGNOSIS — E559 Vitamin D deficiency, unspecified: Secondary | ICD-10-CM | POA: Diagnosis not present

## 2017-06-27 DIAGNOSIS — C4492 Squamous cell carcinoma of skin, unspecified: Secondary | ICD-10-CM | POA: Diagnosis not present

## 2017-06-27 DIAGNOSIS — C20 Malignant neoplasm of rectum: Secondary | ICD-10-CM | POA: Diagnosis not present

## 2017-06-27 DIAGNOSIS — R591 Generalized enlarged lymph nodes: Secondary | ICD-10-CM | POA: Diagnosis not present

## 2017-07-02 DIAGNOSIS — Z452 Encounter for adjustment and management of vascular access device: Secondary | ICD-10-CM | POA: Diagnosis not present

## 2017-07-02 DIAGNOSIS — E039 Hypothyroidism, unspecified: Secondary | ICD-10-CM | POA: Diagnosis not present

## 2017-07-02 DIAGNOSIS — C4492 Squamous cell carcinoma of skin, unspecified: Secondary | ICD-10-CM | POA: Diagnosis not present

## 2017-07-02 DIAGNOSIS — E785 Hyperlipidemia, unspecified: Secondary | ICD-10-CM | POA: Diagnosis not present

## 2017-07-02 DIAGNOSIS — C20 Malignant neoplasm of rectum: Secondary | ICD-10-CM | POA: Diagnosis not present

## 2017-07-02 DIAGNOSIS — Z85038 Personal history of other malignant neoplasm of large intestine: Secondary | ICD-10-CM | POA: Diagnosis not present

## 2017-07-07 DIAGNOSIS — C210 Malignant neoplasm of anus, unspecified: Secondary | ICD-10-CM | POA: Diagnosis not present

## 2017-07-07 DIAGNOSIS — E039 Hypothyroidism, unspecified: Secondary | ICD-10-CM | POA: Diagnosis not present

## 2017-07-07 DIAGNOSIS — C20 Malignant neoplasm of rectum: Secondary | ICD-10-CM | POA: Diagnosis not present

## 2017-07-07 DIAGNOSIS — F329 Major depressive disorder, single episode, unspecified: Secondary | ICD-10-CM | POA: Diagnosis not present

## 2017-07-07 DIAGNOSIS — T451X5A Adverse effect of antineoplastic and immunosuppressive drugs, initial encounter: Secondary | ICD-10-CM | POA: Diagnosis not present

## 2017-07-07 DIAGNOSIS — Z5111 Encounter for antineoplastic chemotherapy: Secondary | ICD-10-CM | POA: Diagnosis not present

## 2017-07-07 DIAGNOSIS — D701 Agranulocytosis secondary to cancer chemotherapy: Secondary | ICD-10-CM | POA: Diagnosis not present

## 2017-07-09 DIAGNOSIS — F329 Major depressive disorder, single episode, unspecified: Secondary | ICD-10-CM | POA: Diagnosis not present

## 2017-07-09 DIAGNOSIS — T451X5A Adverse effect of antineoplastic and immunosuppressive drugs, initial encounter: Secondary | ICD-10-CM | POA: Diagnosis not present

## 2017-07-09 DIAGNOSIS — E039 Hypothyroidism, unspecified: Secondary | ICD-10-CM | POA: Diagnosis not present

## 2017-07-09 DIAGNOSIS — C210 Malignant neoplasm of anus, unspecified: Secondary | ICD-10-CM | POA: Diagnosis not present

## 2017-07-09 DIAGNOSIS — C20 Malignant neoplasm of rectum: Secondary | ICD-10-CM | POA: Diagnosis not present

## 2017-07-09 DIAGNOSIS — Z5111 Encounter for antineoplastic chemotherapy: Secondary | ICD-10-CM | POA: Diagnosis not present

## 2017-07-09 DIAGNOSIS — D701 Agranulocytosis secondary to cancer chemotherapy: Secondary | ICD-10-CM | POA: Diagnosis not present

## 2017-07-16 DIAGNOSIS — E039 Hypothyroidism, unspecified: Secondary | ICD-10-CM | POA: Diagnosis not present

## 2017-07-16 DIAGNOSIS — K6289 Other specified diseases of anus and rectum: Secondary | ICD-10-CM | POA: Diagnosis not present

## 2017-07-16 DIAGNOSIS — T451X5A Adverse effect of antineoplastic and immunosuppressive drugs, initial encounter: Secondary | ICD-10-CM | POA: Diagnosis not present

## 2017-07-16 DIAGNOSIS — F329 Major depressive disorder, single episode, unspecified: Secondary | ICD-10-CM | POA: Diagnosis not present

## 2017-07-16 DIAGNOSIS — C210 Malignant neoplasm of anus, unspecified: Secondary | ICD-10-CM | POA: Diagnosis not present

## 2017-07-16 DIAGNOSIS — C770 Secondary and unspecified malignant neoplasm of lymph nodes of head, face and neck: Secondary | ICD-10-CM | POA: Diagnosis not present

## 2017-07-16 DIAGNOSIS — C20 Malignant neoplasm of rectum: Secondary | ICD-10-CM | POA: Diagnosis not present

## 2017-07-16 DIAGNOSIS — Z5111 Encounter for antineoplastic chemotherapy: Secondary | ICD-10-CM | POA: Diagnosis not present

## 2017-07-16 DIAGNOSIS — K921 Melena: Secondary | ICD-10-CM | POA: Diagnosis not present

## 2017-07-16 DIAGNOSIS — D701 Agranulocytosis secondary to cancer chemotherapy: Secondary | ICD-10-CM | POA: Diagnosis not present

## 2017-07-23 DIAGNOSIS — E039 Hypothyroidism, unspecified: Secondary | ICD-10-CM | POA: Diagnosis not present

## 2017-07-23 DIAGNOSIS — D701 Agranulocytosis secondary to cancer chemotherapy: Secondary | ICD-10-CM | POA: Diagnosis not present

## 2017-07-23 DIAGNOSIS — C210 Malignant neoplasm of anus, unspecified: Secondary | ICD-10-CM | POA: Diagnosis not present

## 2017-07-23 DIAGNOSIS — C20 Malignant neoplasm of rectum: Secondary | ICD-10-CM | POA: Diagnosis not present

## 2017-07-23 DIAGNOSIS — F329 Major depressive disorder, single episode, unspecified: Secondary | ICD-10-CM | POA: Diagnosis not present

## 2017-07-23 DIAGNOSIS — D72810 Lymphocytopenia: Secondary | ICD-10-CM | POA: Diagnosis not present

## 2017-07-23 DIAGNOSIS — C770 Secondary and unspecified malignant neoplasm of lymph nodes of head, face and neck: Secondary | ICD-10-CM | POA: Diagnosis not present

## 2017-07-23 DIAGNOSIS — T451X5A Adverse effect of antineoplastic and immunosuppressive drugs, initial encounter: Secondary | ICD-10-CM | POA: Diagnosis not present

## 2017-07-23 DIAGNOSIS — Z5111 Encounter for antineoplastic chemotherapy: Secondary | ICD-10-CM | POA: Diagnosis not present

## 2017-07-28 DIAGNOSIS — C210 Malignant neoplasm of anus, unspecified: Secondary | ICD-10-CM | POA: Diagnosis not present

## 2017-07-28 DIAGNOSIS — C20 Malignant neoplasm of rectum: Secondary | ICD-10-CM | POA: Diagnosis not present

## 2017-07-28 DIAGNOSIS — R590 Localized enlarged lymph nodes: Secondary | ICD-10-CM | POA: Diagnosis not present

## 2017-07-29 DIAGNOSIS — C210 Malignant neoplasm of anus, unspecified: Secondary | ICD-10-CM | POA: Diagnosis not present

## 2017-07-29 DIAGNOSIS — C20 Malignant neoplasm of rectum: Secondary | ICD-10-CM | POA: Diagnosis not present

## 2017-07-29 DIAGNOSIS — C218 Malignant neoplasm of overlapping sites of rectum, anus and anal canal: Secondary | ICD-10-CM | POA: Diagnosis not present

## 2017-07-29 DIAGNOSIS — R590 Localized enlarged lymph nodes: Secondary | ICD-10-CM | POA: Diagnosis not present

## 2017-07-30 DIAGNOSIS — Z5111 Encounter for antineoplastic chemotherapy: Secondary | ICD-10-CM | POA: Diagnosis not present

## 2017-07-30 DIAGNOSIS — E039 Hypothyroidism, unspecified: Secondary | ICD-10-CM | POA: Diagnosis not present

## 2017-07-30 DIAGNOSIS — D701 Agranulocytosis secondary to cancer chemotherapy: Secondary | ICD-10-CM | POA: Diagnosis not present

## 2017-07-30 DIAGNOSIS — F329 Major depressive disorder, single episode, unspecified: Secondary | ICD-10-CM | POA: Diagnosis not present

## 2017-07-30 DIAGNOSIS — T451X5A Adverse effect of antineoplastic and immunosuppressive drugs, initial encounter: Secondary | ICD-10-CM | POA: Diagnosis not present

## 2017-07-30 DIAGNOSIS — C20 Malignant neoplasm of rectum: Secondary | ICD-10-CM | POA: Diagnosis not present

## 2017-07-30 DIAGNOSIS — C210 Malignant neoplasm of anus, unspecified: Secondary | ICD-10-CM | POA: Diagnosis not present

## 2017-07-31 DIAGNOSIS — C210 Malignant neoplasm of anus, unspecified: Secondary | ICD-10-CM | POA: Diagnosis not present

## 2017-07-31 DIAGNOSIS — R590 Localized enlarged lymph nodes: Secondary | ICD-10-CM | POA: Diagnosis not present

## 2017-08-05 DIAGNOSIS — C20 Malignant neoplasm of rectum: Secondary | ICD-10-CM | POA: Diagnosis not present

## 2017-08-05 DIAGNOSIS — C770 Secondary and unspecified malignant neoplasm of lymph nodes of head, face and neck: Secondary | ICD-10-CM | POA: Diagnosis not present

## 2017-08-12 DIAGNOSIS — C210 Malignant neoplasm of anus, unspecified: Secondary | ICD-10-CM | POA: Diagnosis not present

## 2017-08-12 DIAGNOSIS — R590 Localized enlarged lymph nodes: Secondary | ICD-10-CM | POA: Diagnosis not present

## 2017-08-13 DIAGNOSIS — C210 Malignant neoplasm of anus, unspecified: Secondary | ICD-10-CM | POA: Diagnosis not present

## 2017-08-14 DIAGNOSIS — Z08 Encounter for follow-up examination after completed treatment for malignant neoplasm: Secondary | ICD-10-CM | POA: Diagnosis not present

## 2017-08-14 DIAGNOSIS — C799 Secondary malignant neoplasm of unspecified site: Secondary | ICD-10-CM | POA: Diagnosis not present

## 2017-08-14 DIAGNOSIS — Z85828 Personal history of other malignant neoplasm of skin: Secondary | ICD-10-CM | POA: Diagnosis not present

## 2017-08-19 DIAGNOSIS — C210 Malignant neoplasm of anus, unspecified: Secondary | ICD-10-CM | POA: Diagnosis not present

## 2017-08-19 DIAGNOSIS — C770 Secondary and unspecified malignant neoplasm of lymph nodes of head, face and neck: Secondary | ICD-10-CM | POA: Diagnosis not present

## 2017-08-22 DIAGNOSIS — C210 Malignant neoplasm of anus, unspecified: Secondary | ICD-10-CM | POA: Diagnosis not present

## 2017-08-22 DIAGNOSIS — R590 Localized enlarged lymph nodes: Secondary | ICD-10-CM | POA: Diagnosis not present

## 2017-08-22 DIAGNOSIS — C770 Secondary and unspecified malignant neoplasm of lymph nodes of head, face and neck: Secondary | ICD-10-CM | POA: Diagnosis not present

## 2017-09-02 DIAGNOSIS — R590 Localized enlarged lymph nodes: Secondary | ICD-10-CM | POA: Diagnosis not present

## 2017-09-11 DIAGNOSIS — R590 Localized enlarged lymph nodes: Secondary | ICD-10-CM | POA: Diagnosis not present

## 2017-09-11 DIAGNOSIS — Z885 Allergy status to narcotic agent status: Secondary | ICD-10-CM | POA: Diagnosis not present

## 2017-09-11 DIAGNOSIS — C01 Malignant neoplasm of base of tongue: Secondary | ICD-10-CM | POA: Diagnosis not present

## 2017-09-11 DIAGNOSIS — Z8589 Personal history of malignant neoplasm of other organs and systems: Secondary | ICD-10-CM | POA: Diagnosis not present

## 2017-09-11 DIAGNOSIS — K148 Other diseases of tongue: Secondary | ICD-10-CM | POA: Diagnosis not present

## 2017-09-11 DIAGNOSIS — Z85048 Personal history of other malignant neoplasm of rectum, rectosigmoid junction, and anus: Secondary | ICD-10-CM | POA: Diagnosis not present

## 2017-09-11 DIAGNOSIS — Z79899 Other long term (current) drug therapy: Secondary | ICD-10-CM | POA: Diagnosis not present

## 2017-09-11 DIAGNOSIS — E039 Hypothyroidism, unspecified: Secondary | ICD-10-CM | POA: Diagnosis not present

## 2017-09-15 DIAGNOSIS — G893 Neoplasm related pain (acute) (chronic): Secondary | ICD-10-CM | POA: Diagnosis not present

## 2017-09-15 DIAGNOSIS — G8929 Other chronic pain: Secondary | ICD-10-CM | POA: Diagnosis not present

## 2017-09-15 DIAGNOSIS — G894 Chronic pain syndrome: Secondary | ICD-10-CM | POA: Diagnosis not present

## 2017-09-15 DIAGNOSIS — F1120 Opioid dependence, uncomplicated: Secondary | ICD-10-CM | POA: Diagnosis not present

## 2017-09-22 DIAGNOSIS — C01 Malignant neoplasm of base of tongue: Secondary | ICD-10-CM | POA: Diagnosis not present

## 2017-09-23 DIAGNOSIS — C770 Secondary and unspecified malignant neoplasm of lymph nodes of head, face and neck: Secondary | ICD-10-CM | POA: Diagnosis not present

## 2017-09-23 DIAGNOSIS — C01 Malignant neoplasm of base of tongue: Secondary | ICD-10-CM | POA: Diagnosis not present

## 2017-09-30 DIAGNOSIS — Z923 Personal history of irradiation: Secondary | ICD-10-CM | POA: Diagnosis not present

## 2017-09-30 DIAGNOSIS — C01 Malignant neoplasm of base of tongue: Secondary | ICD-10-CM | POA: Diagnosis not present

## 2017-09-30 DIAGNOSIS — Z9221 Personal history of antineoplastic chemotherapy: Secondary | ICD-10-CM | POA: Diagnosis not present

## 2017-09-30 DIAGNOSIS — R590 Localized enlarged lymph nodes: Secondary | ICD-10-CM | POA: Diagnosis not present

## 2017-09-30 DIAGNOSIS — Z85048 Personal history of other malignant neoplasm of rectum, rectosigmoid junction, and anus: Secondary | ICD-10-CM | POA: Diagnosis not present

## 2017-09-30 DIAGNOSIS — C210 Malignant neoplasm of anus, unspecified: Secondary | ICD-10-CM | POA: Diagnosis not present

## 2017-09-30 DIAGNOSIS — Z885 Allergy status to narcotic agent status: Secondary | ICD-10-CM | POA: Diagnosis not present

## 2017-09-30 DIAGNOSIS — D499 Neoplasm of unspecified behavior of unspecified site: Secondary | ICD-10-CM | POA: Diagnosis not present

## 2017-10-01 DIAGNOSIS — R590 Localized enlarged lymph nodes: Secondary | ICD-10-CM | POA: Diagnosis not present

## 2017-10-01 DIAGNOSIS — C01 Malignant neoplasm of base of tongue: Secondary | ICD-10-CM | POA: Diagnosis not present

## 2017-10-01 DIAGNOSIS — C210 Malignant neoplasm of anus, unspecified: Secondary | ICD-10-CM | POA: Diagnosis not present

## 2017-10-02 DIAGNOSIS — C01 Malignant neoplasm of base of tongue: Secondary | ICD-10-CM | POA: Diagnosis not present

## 2017-10-02 DIAGNOSIS — Z510 Encounter for antineoplastic radiation therapy: Secondary | ICD-10-CM | POA: Diagnosis not present

## 2017-10-02 DIAGNOSIS — C770 Secondary and unspecified malignant neoplasm of lymph nodes of head, face and neck: Secondary | ICD-10-CM | POA: Diagnosis not present

## 2017-10-03 DIAGNOSIS — F329 Major depressive disorder, single episode, unspecified: Secondary | ICD-10-CM | POA: Diagnosis not present

## 2017-10-03 DIAGNOSIS — T451X5A Adverse effect of antineoplastic and immunosuppressive drugs, initial encounter: Secondary | ICD-10-CM | POA: Diagnosis not present

## 2017-10-03 DIAGNOSIS — Z885 Allergy status to narcotic agent status: Secondary | ICD-10-CM | POA: Diagnosis not present

## 2017-10-03 DIAGNOSIS — Z431 Encounter for attention to gastrostomy: Secondary | ICD-10-CM | POA: Diagnosis not present

## 2017-10-03 DIAGNOSIS — C211 Malignant neoplasm of anal canal: Secondary | ICD-10-CM | POA: Diagnosis not present

## 2017-10-03 DIAGNOSIS — E669 Obesity, unspecified: Secondary | ICD-10-CM | POA: Diagnosis not present

## 2017-10-03 DIAGNOSIS — E039 Hypothyroidism, unspecified: Secondary | ICD-10-CM | POA: Diagnosis not present

## 2017-10-03 DIAGNOSIS — C210 Malignant neoplasm of anus, unspecified: Secondary | ICD-10-CM | POA: Diagnosis not present

## 2017-10-03 DIAGNOSIS — D499 Neoplasm of unspecified behavior of unspecified site: Secondary | ICD-10-CM | POA: Diagnosis not present

## 2017-10-03 DIAGNOSIS — Z66 Do not resuscitate: Secondary | ICD-10-CM | POA: Diagnosis not present

## 2017-10-03 DIAGNOSIS — Z6831 Body mass index (BMI) 31.0-31.9, adult: Secondary | ICD-10-CM | POA: Diagnosis not present

## 2017-10-03 DIAGNOSIS — R590 Localized enlarged lymph nodes: Secondary | ICD-10-CM | POA: Diagnosis not present

## 2017-10-03 DIAGNOSIS — R000 Tachycardia, unspecified: Secondary | ICD-10-CM | POA: Diagnosis not present

## 2017-10-03 DIAGNOSIS — Z510 Encounter for antineoplastic radiation therapy: Secondary | ICD-10-CM | POA: Diagnosis not present

## 2017-10-03 DIAGNOSIS — C770 Secondary and unspecified malignant neoplasm of lymph nodes of head, face and neck: Secondary | ICD-10-CM | POA: Diagnosis not present

## 2017-10-03 DIAGNOSIS — R112 Nausea with vomiting, unspecified: Secondary | ICD-10-CM | POA: Diagnosis not present

## 2017-10-03 DIAGNOSIS — C01 Malignant neoplasm of base of tongue: Secondary | ICD-10-CM | POA: Diagnosis not present

## 2017-10-03 DIAGNOSIS — E860 Dehydration: Secondary | ICD-10-CM | POA: Diagnosis not present

## 2017-10-06 DIAGNOSIS — C01 Malignant neoplasm of base of tongue: Secondary | ICD-10-CM | POA: Diagnosis not present

## 2017-10-06 DIAGNOSIS — T451X5A Adverse effect of antineoplastic and immunosuppressive drugs, initial encounter: Secondary | ICD-10-CM | POA: Diagnosis not present

## 2017-10-06 DIAGNOSIS — E039 Hypothyroidism, unspecified: Secondary | ICD-10-CM | POA: Diagnosis not present

## 2017-10-06 DIAGNOSIS — F329 Major depressive disorder, single episode, unspecified: Secondary | ICD-10-CM | POA: Diagnosis not present

## 2017-10-06 DIAGNOSIS — R112 Nausea with vomiting, unspecified: Secondary | ICD-10-CM | POA: Diagnosis not present

## 2017-10-06 DIAGNOSIS — C770 Secondary and unspecified malignant neoplasm of lymph nodes of head, face and neck: Secondary | ICD-10-CM | POA: Diagnosis not present

## 2017-10-06 DIAGNOSIS — Z510 Encounter for antineoplastic radiation therapy: Secondary | ICD-10-CM | POA: Diagnosis not present

## 2017-10-07 DIAGNOSIS — C770 Secondary and unspecified malignant neoplasm of lymph nodes of head, face and neck: Secondary | ICD-10-CM | POA: Diagnosis not present

## 2017-10-07 DIAGNOSIS — C01 Malignant neoplasm of base of tongue: Secondary | ICD-10-CM | POA: Diagnosis not present

## 2017-10-07 DIAGNOSIS — R112 Nausea with vomiting, unspecified: Secondary | ICD-10-CM | POA: Diagnosis not present

## 2017-10-07 DIAGNOSIS — Z510 Encounter for antineoplastic radiation therapy: Secondary | ICD-10-CM | POA: Diagnosis not present

## 2017-10-07 DIAGNOSIS — T451X5A Adverse effect of antineoplastic and immunosuppressive drugs, initial encounter: Secondary | ICD-10-CM | POA: Diagnosis not present

## 2017-10-07 DIAGNOSIS — E039 Hypothyroidism, unspecified: Secondary | ICD-10-CM | POA: Diagnosis not present

## 2017-10-07 DIAGNOSIS — F329 Major depressive disorder, single episode, unspecified: Secondary | ICD-10-CM | POA: Diagnosis not present

## 2017-10-08 DIAGNOSIS — C210 Malignant neoplasm of anus, unspecified: Secondary | ICD-10-CM | POA: Diagnosis not present

## 2017-10-08 DIAGNOSIS — F329 Major depressive disorder, single episode, unspecified: Secondary | ICD-10-CM | POA: Diagnosis not present

## 2017-10-08 DIAGNOSIS — E039 Hypothyroidism, unspecified: Secondary | ICD-10-CM | POA: Diagnosis not present

## 2017-10-08 DIAGNOSIS — C01 Malignant neoplasm of base of tongue: Secondary | ICD-10-CM | POA: Diagnosis not present

## 2017-10-08 DIAGNOSIS — D499 Neoplasm of unspecified behavior of unspecified site: Secondary | ICD-10-CM | POA: Diagnosis not present

## 2017-10-08 DIAGNOSIS — C770 Secondary and unspecified malignant neoplasm of lymph nodes of head, face and neck: Secondary | ICD-10-CM | POA: Diagnosis not present

## 2017-10-08 DIAGNOSIS — Z431 Encounter for attention to gastrostomy: Secondary | ICD-10-CM | POA: Diagnosis not present

## 2017-10-08 DIAGNOSIS — T451X5A Adverse effect of antineoplastic and immunosuppressive drugs, initial encounter: Secondary | ICD-10-CM | POA: Diagnosis not present

## 2017-10-08 DIAGNOSIS — Z510 Encounter for antineoplastic radiation therapy: Secondary | ICD-10-CM | POA: Diagnosis not present

## 2017-10-08 DIAGNOSIS — R590 Localized enlarged lymph nodes: Secondary | ICD-10-CM | POA: Diagnosis not present

## 2017-10-08 DIAGNOSIS — R112 Nausea with vomiting, unspecified: Secondary | ICD-10-CM | POA: Diagnosis not present

## 2017-10-09 DIAGNOSIS — Z510 Encounter for antineoplastic radiation therapy: Secondary | ICD-10-CM | POA: Diagnosis not present

## 2017-10-09 DIAGNOSIS — C770 Secondary and unspecified malignant neoplasm of lymph nodes of head, face and neck: Secondary | ICD-10-CM | POA: Diagnosis not present

## 2017-10-09 DIAGNOSIS — C01 Malignant neoplasm of base of tongue: Secondary | ICD-10-CM | POA: Diagnosis not present

## 2017-10-09 DIAGNOSIS — R1312 Dysphagia, oropharyngeal phase: Secondary | ICD-10-CM | POA: Diagnosis not present

## 2017-10-10 DIAGNOSIS — Z510 Encounter for antineoplastic radiation therapy: Secondary | ICD-10-CM | POA: Diagnosis not present

## 2017-10-10 DIAGNOSIS — C770 Secondary and unspecified malignant neoplasm of lymph nodes of head, face and neck: Secondary | ICD-10-CM | POA: Diagnosis not present

## 2017-10-10 DIAGNOSIS — C01 Malignant neoplasm of base of tongue: Secondary | ICD-10-CM | POA: Diagnosis not present

## 2017-10-13 DIAGNOSIS — Z510 Encounter for antineoplastic radiation therapy: Secondary | ICD-10-CM | POA: Diagnosis not present

## 2017-10-13 DIAGNOSIS — C770 Secondary and unspecified malignant neoplasm of lymph nodes of head, face and neck: Secondary | ICD-10-CM | POA: Diagnosis not present

## 2017-10-13 DIAGNOSIS — C01 Malignant neoplasm of base of tongue: Secondary | ICD-10-CM | POA: Diagnosis not present

## 2017-10-14 DIAGNOSIS — Z85048 Personal history of other malignant neoplasm of rectum, rectosigmoid junction, and anus: Secondary | ICD-10-CM | POA: Diagnosis not present

## 2017-10-14 DIAGNOSIS — R1312 Dysphagia, oropharyngeal phase: Secondary | ICD-10-CM | POA: Diagnosis not present

## 2017-10-14 DIAGNOSIS — C770 Secondary and unspecified malignant neoplasm of lymph nodes of head, face and neck: Secondary | ICD-10-CM | POA: Diagnosis not present

## 2017-10-14 DIAGNOSIS — C01 Malignant neoplasm of base of tongue: Secondary | ICD-10-CM | POA: Diagnosis not present

## 2017-10-14 DIAGNOSIS — C210 Malignant neoplasm of anus, unspecified: Secondary | ICD-10-CM | POA: Diagnosis not present

## 2017-10-14 DIAGNOSIS — Z510 Encounter for antineoplastic radiation therapy: Secondary | ICD-10-CM | POA: Diagnosis not present

## 2017-10-14 DIAGNOSIS — K1231 Oral mucositis (ulcerative) due to antineoplastic therapy: Secondary | ICD-10-CM | POA: Diagnosis not present

## 2017-10-15 DIAGNOSIS — C770 Secondary and unspecified malignant neoplasm of lymph nodes of head, face and neck: Secondary | ICD-10-CM | POA: Diagnosis not present

## 2017-10-15 DIAGNOSIS — C01 Malignant neoplasm of base of tongue: Secondary | ICD-10-CM | POA: Diagnosis not present

## 2017-10-15 DIAGNOSIS — C210 Malignant neoplasm of anus, unspecified: Secondary | ICD-10-CM | POA: Diagnosis not present

## 2017-10-15 DIAGNOSIS — Z510 Encounter for antineoplastic radiation therapy: Secondary | ICD-10-CM | POA: Diagnosis not present

## 2017-10-16 DIAGNOSIS — C770 Secondary and unspecified malignant neoplasm of lymph nodes of head, face and neck: Secondary | ICD-10-CM | POA: Diagnosis not present

## 2017-10-16 DIAGNOSIS — C01 Malignant neoplasm of base of tongue: Secondary | ICD-10-CM | POA: Diagnosis not present

## 2017-10-16 DIAGNOSIS — Z510 Encounter for antineoplastic radiation therapy: Secondary | ICD-10-CM | POA: Diagnosis not present

## 2017-10-17 DIAGNOSIS — C770 Secondary and unspecified malignant neoplasm of lymph nodes of head, face and neck: Secondary | ICD-10-CM | POA: Diagnosis not present

## 2017-10-17 DIAGNOSIS — C01 Malignant neoplasm of base of tongue: Secondary | ICD-10-CM | POA: Diagnosis not present

## 2017-10-17 DIAGNOSIS — Z510 Encounter for antineoplastic radiation therapy: Secondary | ICD-10-CM | POA: Diagnosis not present

## 2017-10-20 DIAGNOSIS — C01 Malignant neoplasm of base of tongue: Secondary | ICD-10-CM | POA: Diagnosis not present

## 2017-10-20 DIAGNOSIS — C770 Secondary and unspecified malignant neoplasm of lymph nodes of head, face and neck: Secondary | ICD-10-CM | POA: Diagnosis not present

## 2017-10-20 DIAGNOSIS — Z510 Encounter for antineoplastic radiation therapy: Secondary | ICD-10-CM | POA: Diagnosis not present

## 2017-10-21 DIAGNOSIS — C770 Secondary and unspecified malignant neoplasm of lymph nodes of head, face and neck: Secondary | ICD-10-CM | POA: Diagnosis not present

## 2017-10-21 DIAGNOSIS — Z85048 Personal history of other malignant neoplasm of rectum, rectosigmoid junction, and anus: Secondary | ICD-10-CM | POA: Diagnosis not present

## 2017-10-21 DIAGNOSIS — K140 Glossitis: Secondary | ICD-10-CM | POA: Diagnosis not present

## 2017-10-21 DIAGNOSIS — C01 Malignant neoplasm of base of tongue: Secondary | ICD-10-CM | POA: Diagnosis not present

## 2017-10-21 DIAGNOSIS — K1230 Oral mucositis (ulcerative), unspecified: Secondary | ICD-10-CM | POA: Diagnosis not present

## 2017-10-21 DIAGNOSIS — Z510 Encounter for antineoplastic radiation therapy: Secondary | ICD-10-CM | POA: Diagnosis not present

## 2017-10-21 DIAGNOSIS — K9422 Gastrostomy infection: Secondary | ICD-10-CM | POA: Diagnosis not present

## 2017-10-21 DIAGNOSIS — L03818 Cellulitis of other sites: Secondary | ICD-10-CM | POA: Diagnosis not present

## 2017-10-22 DIAGNOSIS — Z510 Encounter for antineoplastic radiation therapy: Secondary | ICD-10-CM | POA: Diagnosis not present

## 2017-10-22 DIAGNOSIS — C770 Secondary and unspecified malignant neoplasm of lymph nodes of head, face and neck: Secondary | ICD-10-CM | POA: Diagnosis not present

## 2017-10-22 DIAGNOSIS — C01 Malignant neoplasm of base of tongue: Secondary | ICD-10-CM | POA: Diagnosis not present

## 2017-10-23 DIAGNOSIS — C770 Secondary and unspecified malignant neoplasm of lymph nodes of head, face and neck: Secondary | ICD-10-CM | POA: Diagnosis not present

## 2017-10-23 DIAGNOSIS — C01 Malignant neoplasm of base of tongue: Secondary | ICD-10-CM | POA: Diagnosis not present

## 2017-10-23 DIAGNOSIS — Z510 Encounter for antineoplastic radiation therapy: Secondary | ICD-10-CM | POA: Diagnosis not present

## 2017-10-24 DIAGNOSIS — C770 Secondary and unspecified malignant neoplasm of lymph nodes of head, face and neck: Secondary | ICD-10-CM | POA: Diagnosis not present

## 2017-10-24 DIAGNOSIS — Z510 Encounter for antineoplastic radiation therapy: Secondary | ICD-10-CM | POA: Diagnosis not present

## 2017-10-24 DIAGNOSIS — C01 Malignant neoplasm of base of tongue: Secondary | ICD-10-CM | POA: Diagnosis not present

## 2017-10-27 DIAGNOSIS — Z510 Encounter for antineoplastic radiation therapy: Secondary | ICD-10-CM | POA: Diagnosis not present

## 2017-10-27 DIAGNOSIS — C770 Secondary and unspecified malignant neoplasm of lymph nodes of head, face and neck: Secondary | ICD-10-CM | POA: Diagnosis not present

## 2017-10-27 DIAGNOSIS — C01 Malignant neoplasm of base of tongue: Secondary | ICD-10-CM | POA: Diagnosis not present

## 2017-10-28 DIAGNOSIS — C7989 Secondary malignant neoplasm of other specified sites: Secondary | ICD-10-CM | POA: Diagnosis not present

## 2017-10-28 DIAGNOSIS — R112 Nausea with vomiting, unspecified: Secondary | ICD-10-CM | POA: Diagnosis not present

## 2017-10-28 DIAGNOSIS — Z510 Encounter for antineoplastic radiation therapy: Secondary | ICD-10-CM | POA: Diagnosis not present

## 2017-10-28 DIAGNOSIS — T451X5A Adverse effect of antineoplastic and immunosuppressive drugs, initial encounter: Secondary | ICD-10-CM | POA: Diagnosis not present

## 2017-10-28 DIAGNOSIS — K9422 Gastrostomy infection: Secondary | ICD-10-CM | POA: Diagnosis not present

## 2017-10-28 DIAGNOSIS — C770 Secondary and unspecified malignant neoplasm of lymph nodes of head, face and neck: Secondary | ICD-10-CM | POA: Diagnosis not present

## 2017-10-28 DIAGNOSIS — Z85048 Personal history of other malignant neoplasm of rectum, rectosigmoid junction, and anus: Secondary | ICD-10-CM | POA: Diagnosis not present

## 2017-10-28 DIAGNOSIS — K140 Glossitis: Secondary | ICD-10-CM | POA: Diagnosis not present

## 2017-10-28 DIAGNOSIS — C01 Malignant neoplasm of base of tongue: Secondary | ICD-10-CM | POA: Diagnosis not present

## 2017-10-29 DIAGNOSIS — C01 Malignant neoplasm of base of tongue: Secondary | ICD-10-CM | POA: Diagnosis not present

## 2017-10-29 DIAGNOSIS — C770 Secondary and unspecified malignant neoplasm of lymph nodes of head, face and neck: Secondary | ICD-10-CM | POA: Diagnosis not present

## 2017-10-29 DIAGNOSIS — Z510 Encounter for antineoplastic radiation therapy: Secondary | ICD-10-CM | POA: Diagnosis not present

## 2017-10-30 DIAGNOSIS — Z510 Encounter for antineoplastic radiation therapy: Secondary | ICD-10-CM | POA: Diagnosis not present

## 2017-10-30 DIAGNOSIS — C770 Secondary and unspecified malignant neoplasm of lymph nodes of head, face and neck: Secondary | ICD-10-CM | POA: Diagnosis not present

## 2017-10-30 DIAGNOSIS — C01 Malignant neoplasm of base of tongue: Secondary | ICD-10-CM | POA: Diagnosis not present

## 2017-10-31 DIAGNOSIS — C770 Secondary and unspecified malignant neoplasm of lymph nodes of head, face and neck: Secondary | ICD-10-CM | POA: Diagnosis not present

## 2017-10-31 DIAGNOSIS — C01 Malignant neoplasm of base of tongue: Secondary | ICD-10-CM | POA: Diagnosis not present

## 2017-10-31 DIAGNOSIS — R1312 Dysphagia, oropharyngeal phase: Secondary | ICD-10-CM | POA: Diagnosis not present

## 2017-10-31 DIAGNOSIS — Z510 Encounter for antineoplastic radiation therapy: Secondary | ICD-10-CM | POA: Diagnosis not present

## 2017-10-31 DIAGNOSIS — R112 Nausea with vomiting, unspecified: Secondary | ICD-10-CM | POA: Diagnosis not present

## 2017-11-01 DIAGNOSIS — C01 Malignant neoplasm of base of tongue: Secondary | ICD-10-CM | POA: Diagnosis not present

## 2017-11-01 DIAGNOSIS — R112 Nausea with vomiting, unspecified: Secondary | ICD-10-CM | POA: Diagnosis not present

## 2017-11-01 DIAGNOSIS — R1312 Dysphagia, oropharyngeal phase: Secondary | ICD-10-CM | POA: Diagnosis not present

## 2017-11-03 DIAGNOSIS — C770 Secondary and unspecified malignant neoplasm of lymph nodes of head, face and neck: Secondary | ICD-10-CM | POA: Diagnosis not present

## 2017-11-03 DIAGNOSIS — C01 Malignant neoplasm of base of tongue: Secondary | ICD-10-CM | POA: Diagnosis not present

## 2017-11-03 DIAGNOSIS — Z510 Encounter for antineoplastic radiation therapy: Secondary | ICD-10-CM | POA: Diagnosis not present

## 2017-11-04 DIAGNOSIS — K1230 Oral mucositis (ulcerative), unspecified: Secondary | ICD-10-CM | POA: Diagnosis not present

## 2017-11-04 DIAGNOSIS — Z931 Gastrostomy status: Secondary | ICD-10-CM | POA: Diagnosis not present

## 2017-11-04 DIAGNOSIS — Z85048 Personal history of other malignant neoplasm of rectum, rectosigmoid junction, and anus: Secondary | ICD-10-CM | POA: Diagnosis not present

## 2017-11-04 DIAGNOSIS — C01 Malignant neoplasm of base of tongue: Secondary | ICD-10-CM | POA: Diagnosis not present

## 2017-11-04 DIAGNOSIS — Z510 Encounter for antineoplastic radiation therapy: Secondary | ICD-10-CM | POA: Diagnosis not present

## 2017-11-04 DIAGNOSIS — C770 Secondary and unspecified malignant neoplasm of lymph nodes of head, face and neck: Secondary | ICD-10-CM | POA: Diagnosis not present

## 2017-11-04 DIAGNOSIS — Z885 Allergy status to narcotic agent status: Secondary | ICD-10-CM | POA: Diagnosis not present

## 2017-11-05 DIAGNOSIS — C01 Malignant neoplasm of base of tongue: Secondary | ICD-10-CM | POA: Diagnosis not present

## 2017-11-05 DIAGNOSIS — C770 Secondary and unspecified malignant neoplasm of lymph nodes of head, face and neck: Secondary | ICD-10-CM | POA: Diagnosis not present

## 2017-11-05 DIAGNOSIS — Z510 Encounter for antineoplastic radiation therapy: Secondary | ICD-10-CM | POA: Diagnosis not present

## 2017-11-06 DIAGNOSIS — C01 Malignant neoplasm of base of tongue: Secondary | ICD-10-CM | POA: Diagnosis not present

## 2017-11-06 DIAGNOSIS — C770 Secondary and unspecified malignant neoplasm of lymph nodes of head, face and neck: Secondary | ICD-10-CM | POA: Diagnosis not present

## 2017-11-06 DIAGNOSIS — Z510 Encounter for antineoplastic radiation therapy: Secondary | ICD-10-CM | POA: Diagnosis not present

## 2017-11-07 DIAGNOSIS — Z510 Encounter for antineoplastic radiation therapy: Secondary | ICD-10-CM | POA: Diagnosis not present

## 2017-11-07 DIAGNOSIS — R634 Abnormal weight loss: Secondary | ICD-10-CM | POA: Diagnosis not present

## 2017-11-07 DIAGNOSIS — C770 Secondary and unspecified malignant neoplasm of lymph nodes of head, face and neck: Secondary | ICD-10-CM | POA: Diagnosis not present

## 2017-11-07 DIAGNOSIS — C01 Malignant neoplasm of base of tongue: Secondary | ICD-10-CM | POA: Diagnosis not present

## 2017-11-10 DIAGNOSIS — Z510 Encounter for antineoplastic radiation therapy: Secondary | ICD-10-CM | POA: Diagnosis not present

## 2017-11-10 DIAGNOSIS — C770 Secondary and unspecified malignant neoplasm of lymph nodes of head, face and neck: Secondary | ICD-10-CM | POA: Diagnosis not present

## 2017-11-10 DIAGNOSIS — C01 Malignant neoplasm of base of tongue: Secondary | ICD-10-CM | POA: Diagnosis not present

## 2017-11-11 DIAGNOSIS — C770 Secondary and unspecified malignant neoplasm of lymph nodes of head, face and neck: Secondary | ICD-10-CM | POA: Diagnosis not present

## 2017-11-11 DIAGNOSIS — Z510 Encounter for antineoplastic radiation therapy: Secondary | ICD-10-CM | POA: Diagnosis not present

## 2017-11-11 DIAGNOSIS — C01 Malignant neoplasm of base of tongue: Secondary | ICD-10-CM | POA: Diagnosis not present

## 2017-11-12 DIAGNOSIS — C770 Secondary and unspecified malignant neoplasm of lymph nodes of head, face and neck: Secondary | ICD-10-CM | POA: Diagnosis not present

## 2017-11-12 DIAGNOSIS — C01 Malignant neoplasm of base of tongue: Secondary | ICD-10-CM | POA: Diagnosis not present

## 2017-11-12 DIAGNOSIS — Z510 Encounter for antineoplastic radiation therapy: Secondary | ICD-10-CM | POA: Diagnosis not present

## 2017-11-13 DIAGNOSIS — Z510 Encounter for antineoplastic radiation therapy: Secondary | ICD-10-CM | POA: Diagnosis not present

## 2017-11-13 DIAGNOSIS — C01 Malignant neoplasm of base of tongue: Secondary | ICD-10-CM | POA: Diagnosis not present

## 2017-11-13 DIAGNOSIS — C770 Secondary and unspecified malignant neoplasm of lymph nodes of head, face and neck: Secondary | ICD-10-CM | POA: Diagnosis not present

## 2017-11-13 DIAGNOSIS — Z931 Gastrostomy status: Secondary | ICD-10-CM | POA: Diagnosis not present

## 2017-11-14 DIAGNOSIS — Z510 Encounter for antineoplastic radiation therapy: Secondary | ICD-10-CM | POA: Diagnosis not present

## 2017-11-14 DIAGNOSIS — C770 Secondary and unspecified malignant neoplasm of lymph nodes of head, face and neck: Secondary | ICD-10-CM | POA: Diagnosis not present

## 2017-11-14 DIAGNOSIS — C01 Malignant neoplasm of base of tongue: Secondary | ICD-10-CM | POA: Diagnosis not present

## 2017-11-17 DIAGNOSIS — C01 Malignant neoplasm of base of tongue: Secondary | ICD-10-CM | POA: Diagnosis not present

## 2017-11-17 DIAGNOSIS — Z510 Encounter for antineoplastic radiation therapy: Secondary | ICD-10-CM | POA: Diagnosis not present

## 2017-11-17 DIAGNOSIS — C770 Secondary and unspecified malignant neoplasm of lymph nodes of head, face and neck: Secondary | ICD-10-CM | POA: Diagnosis not present

## 2017-11-18 DIAGNOSIS — R638 Other symptoms and signs concerning food and fluid intake: Secondary | ICD-10-CM | POA: Diagnosis not present

## 2017-11-18 DIAGNOSIS — Z931 Gastrostomy status: Secondary | ICD-10-CM | POA: Diagnosis not present

## 2017-11-18 DIAGNOSIS — C01 Malignant neoplasm of base of tongue: Secondary | ICD-10-CM | POA: Diagnosis not present

## 2017-11-21 DIAGNOSIS — R638 Other symptoms and signs concerning food and fluid intake: Secondary | ICD-10-CM | POA: Diagnosis not present

## 2017-11-21 DIAGNOSIS — C01 Malignant neoplasm of base of tongue: Secondary | ICD-10-CM | POA: Diagnosis not present

## 2017-11-25 DIAGNOSIS — C770 Secondary and unspecified malignant neoplasm of lymph nodes of head, face and neck: Secondary | ICD-10-CM | POA: Diagnosis not present

## 2017-11-25 DIAGNOSIS — C01 Malignant neoplasm of base of tongue: Secondary | ICD-10-CM | POA: Diagnosis not present

## 2017-11-25 DIAGNOSIS — Z85048 Personal history of other malignant neoplasm of rectum, rectosigmoid junction, and anus: Secondary | ICD-10-CM | POA: Diagnosis not present

## 2017-11-25 DIAGNOSIS — D709 Neutropenia, unspecified: Secondary | ICD-10-CM | POA: Diagnosis not present

## 2017-11-25 DIAGNOSIS — R112 Nausea with vomiting, unspecified: Secondary | ICD-10-CM | POA: Diagnosis not present

## 2017-11-25 DIAGNOSIS — R638 Other symptoms and signs concerning food and fluid intake: Secondary | ICD-10-CM | POA: Diagnosis not present

## 2017-11-25 DIAGNOSIS — K1231 Oral mucositis (ulcerative) due to antineoplastic therapy: Secondary | ICD-10-CM | POA: Diagnosis not present

## 2017-11-25 DIAGNOSIS — R634 Abnormal weight loss: Secondary | ICD-10-CM | POA: Diagnosis not present

## 2017-11-25 DIAGNOSIS — R1312 Dysphagia, oropharyngeal phase: Secondary | ICD-10-CM | POA: Diagnosis not present

## 2017-12-01 DIAGNOSIS — R1312 Dysphagia, oropharyngeal phase: Secondary | ICD-10-CM | POA: Diagnosis not present

## 2017-12-01 DIAGNOSIS — C01 Malignant neoplasm of base of tongue: Secondary | ICD-10-CM | POA: Diagnosis not present

## 2017-12-01 DIAGNOSIS — R112 Nausea with vomiting, unspecified: Secondary | ICD-10-CM | POA: Diagnosis not present

## 2017-12-01 DIAGNOSIS — Z934 Other artificial openings of gastrointestinal tract status: Secondary | ICD-10-CM | POA: Diagnosis not present

## 2017-12-04 DIAGNOSIS — R638 Other symptoms and signs concerning food and fluid intake: Secondary | ICD-10-CM | POA: Diagnosis not present

## 2017-12-04 DIAGNOSIS — R112 Nausea with vomiting, unspecified: Secondary | ICD-10-CM | POA: Diagnosis not present

## 2017-12-04 DIAGNOSIS — R1312 Dysphagia, oropharyngeal phase: Secondary | ICD-10-CM | POA: Diagnosis not present

## 2017-12-04 DIAGNOSIS — C01 Malignant neoplasm of base of tongue: Secondary | ICD-10-CM | POA: Diagnosis not present

## 2017-12-04 DIAGNOSIS — T451X5A Adverse effect of antineoplastic and immunosuppressive drugs, initial encounter: Secondary | ICD-10-CM | POA: Diagnosis not present

## 2017-12-04 DIAGNOSIS — X58XXXA Exposure to other specified factors, initial encounter: Secondary | ICD-10-CM | POA: Diagnosis not present

## 2017-12-16 DIAGNOSIS — R1312 Dysphagia, oropharyngeal phase: Secondary | ICD-10-CM | POA: Diagnosis not present

## 2017-12-16 DIAGNOSIS — C01 Malignant neoplasm of base of tongue: Secondary | ICD-10-CM | POA: Diagnosis not present

## 2017-12-16 DIAGNOSIS — K1231 Oral mucositis (ulcerative) due to antineoplastic therapy: Secondary | ICD-10-CM | POA: Diagnosis not present

## 2017-12-16 DIAGNOSIS — Z85048 Personal history of other malignant neoplasm of rectum, rectosigmoid junction, and anus: Secondary | ICD-10-CM | POA: Diagnosis not present

## 2017-12-16 DIAGNOSIS — Z931 Gastrostomy status: Secondary | ICD-10-CM | POA: Diagnosis not present

## 2017-12-16 DIAGNOSIS — E039 Hypothyroidism, unspecified: Secondary | ICD-10-CM | POA: Diagnosis not present

## 2018-01-05 DIAGNOSIS — C01 Malignant neoplasm of base of tongue: Secondary | ICD-10-CM | POA: Diagnosis not present

## 2018-01-06 DIAGNOSIS — R030 Elevated blood-pressure reading, without diagnosis of hypertension: Secondary | ICD-10-CM | POA: Diagnosis not present

## 2018-01-06 DIAGNOSIS — E039 Hypothyroidism, unspecified: Secondary | ICD-10-CM | POA: Diagnosis not present

## 2018-01-06 DIAGNOSIS — G44209 Tension-type headache, unspecified, not intractable: Secondary | ICD-10-CM | POA: Diagnosis not present

## 2018-02-17 DIAGNOSIS — R1310 Dysphagia, unspecified: Secondary | ICD-10-CM | POA: Diagnosis not present

## 2018-02-17 DIAGNOSIS — F332 Major depressive disorder, recurrent severe without psychotic features: Secondary | ICD-10-CM | POA: Diagnosis not present

## 2018-02-17 DIAGNOSIS — E039 Hypothyroidism, unspecified: Secondary | ICD-10-CM | POA: Diagnosis not present

## 2018-02-20 DIAGNOSIS — Z08 Encounter for follow-up examination after completed treatment for malignant neoplasm: Secondary | ICD-10-CM | POA: Diagnosis not present

## 2018-02-20 DIAGNOSIS — C01 Malignant neoplasm of base of tongue: Secondary | ICD-10-CM | POA: Diagnosis not present

## 2018-02-20 DIAGNOSIS — R1319 Other dysphagia: Secondary | ICD-10-CM | POA: Diagnosis not present

## 2018-02-26 DIAGNOSIS — C01 Malignant neoplasm of base of tongue: Secondary | ICD-10-CM | POA: Diagnosis not present

## 2018-03-07 DIAGNOSIS — F4322 Adjustment disorder with anxiety: Secondary | ICD-10-CM | POA: Diagnosis not present

## 2018-03-13 DIAGNOSIS — F4322 Adjustment disorder with anxiety: Secondary | ICD-10-CM | POA: Diagnosis not present

## 2018-03-26 DIAGNOSIS — C01 Malignant neoplasm of base of tongue: Secondary | ICD-10-CM | POA: Diagnosis not present

## 2018-03-26 DIAGNOSIS — Z9221 Personal history of antineoplastic chemotherapy: Secondary | ICD-10-CM | POA: Diagnosis not present

## 2018-03-26 DIAGNOSIS — Z923 Personal history of irradiation: Secondary | ICD-10-CM | POA: Diagnosis not present

## 2018-03-27 DIAGNOSIS — E039 Hypothyroidism, unspecified: Secondary | ICD-10-CM | POA: Diagnosis not present

## 2018-03-27 DIAGNOSIS — F4322 Adjustment disorder with anxiety: Secondary | ICD-10-CM | POA: Diagnosis not present

## 2018-03-27 DIAGNOSIS — Z23 Encounter for immunization: Secondary | ICD-10-CM | POA: Diagnosis not present

## 2018-03-30 DIAGNOSIS — G894 Chronic pain syndrome: Secondary | ICD-10-CM | POA: Diagnosis not present

## 2018-03-30 DIAGNOSIS — M169 Osteoarthritis of hip, unspecified: Secondary | ICD-10-CM | POA: Diagnosis not present

## 2018-03-30 DIAGNOSIS — F1120 Opioid dependence, uncomplicated: Secondary | ICD-10-CM | POA: Diagnosis not present

## 2018-03-30 DIAGNOSIS — M25572 Pain in left ankle and joints of left foot: Secondary | ICD-10-CM | POA: Diagnosis not present

## 2018-05-25 DIAGNOSIS — G894 Chronic pain syndrome: Secondary | ICD-10-CM | POA: Diagnosis not present

## 2018-05-25 DIAGNOSIS — M169 Osteoarthritis of hip, unspecified: Secondary | ICD-10-CM | POA: Diagnosis not present

## 2018-05-25 DIAGNOSIS — F1120 Opioid dependence, uncomplicated: Secondary | ICD-10-CM | POA: Diagnosis not present

## 2018-05-25 DIAGNOSIS — M25572 Pain in left ankle and joints of left foot: Secondary | ICD-10-CM | POA: Diagnosis not present

## 2018-06-19 DIAGNOSIS — R439 Unspecified disturbances of smell and taste: Secondary | ICD-10-CM | POA: Diagnosis not present

## 2018-06-19 DIAGNOSIS — K117 Disturbances of salivary secretion: Secondary | ICD-10-CM | POA: Diagnosis not present

## 2018-06-19 DIAGNOSIS — C01 Malignant neoplasm of base of tongue: Secondary | ICD-10-CM | POA: Diagnosis not present

## 2022-07-12 ENCOUNTER — Ambulatory Visit: Payer: Self-pay | Admitting: Internal Medicine

## 2022-07-12 ENCOUNTER — Encounter: Payer: Self-pay | Admitting: Internal Medicine

## 2022-07-12 VITALS — BP 110/70 | HR 89 | Temp 98.0°F | Resp 16 | Ht 60.0 in | Wt 121.6 lb

## 2022-07-12 DIAGNOSIS — E039 Hypothyroidism, unspecified: Secondary | ICD-10-CM

## 2022-07-12 DIAGNOSIS — Z85038 Personal history of other malignant neoplasm of large intestine: Secondary | ICD-10-CM | POA: Insufficient documentation

## 2022-07-12 DIAGNOSIS — F411 Generalized anxiety disorder: Secondary | ICD-10-CM | POA: Insufficient documentation

## 2022-07-12 DIAGNOSIS — Z0000 Encounter for general adult medical examination without abnormal findings: Secondary | ICD-10-CM | POA: Insufficient documentation

## 2022-07-12 DIAGNOSIS — F4310 Post-traumatic stress disorder, unspecified: Secondary | ICD-10-CM | POA: Insufficient documentation

## 2022-07-12 DIAGNOSIS — Z6823 Body mass index (BMI) 23.0-23.9, adult: Secondary | ICD-10-CM | POA: Insufficient documentation

## 2022-07-12 DIAGNOSIS — F330 Major depressive disorder, recurrent, mild: Secondary | ICD-10-CM

## 2022-07-12 MED ORDER — BREXPIPRAZOLE 1 MG PO TABS: 1.0000 mg | ORAL_TABLET | Freq: Every day | ORAL | 3 refills | Status: AC

## 2022-07-12 NOTE — Assessment & Plan Note (Signed)
She is seeing a Scientist, research (life sciences) and this is working out well for her.

## 2022-07-12 NOTE — Assessment & Plan Note (Signed)
She mild recurrent major depression where we  will cotninue her current medications.

## 2022-07-12 NOTE — Assessment & Plan Note (Signed)
Health maintenance discussed.  She does not want any more mammograms.  She has eaten already today and we will send an order for labs.

## 2022-07-12 NOTE — Progress Notes (Signed)
Office Visit  Subjective   Patient ID: Alice Pena   DOB: 1961/04/03   Age: 62 y.o.   MRN: [id number]   Chief Complaint Chief Complaint  Patient presents with   Annual Exam     History of Present Illness Alice Pena is a 62 year old Caucasian/White female who presents for her annual health maintenance exam.  This patient's past medical history Depression, History of Anal cancer, History of Colon Cancer, Hypothyroidism, Neuropathy, and Tongue Cancer.   Her last eye exam was in 03/2022 and she states she not had any changes in her vision but she has blurred vision due to effects from chemotherapy. She had her colonoscopy in 2015 where a mass was found and she was diagnosed with Stage III colon cancer (see below). Today, she states she does not want another colonoscopy due to she would not want treatment if they found anything. Her last mammogram was in 2020 and was normal. She also states she does not want anymore mammograms as she would not undergo treatment if they found anyting. Her last PAP smear was in 01/2019 and it was normal per patient. She does exercise with using a bowflex. The patient does get yearly flu vaccines. She has had 4 COVID-19 vaccines including 2 boosters. There is no family history of heart disease and she is not on an ASA.   Alice Pena returns for followup weight loss management.  She remains on wegovy at 2.'4mg'$  subcut weekly.  She was started on wegovy in 08/2021.  She denies any side effects from her wegovy.  The patient continues to eat healthy and she is exercising 4 times per week with walking and doing yoga and bow flex.  S She has been on adipex in the past without any problems.    I also this past year for an ER followup where she had a near syncope episode at work on 02/20/2022.  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  This occurred as well when she started walking down the  hallway.  She presented to the ER where an EKG showed sinus tachycardia.  She did not endorse any chest pain, palpitations, focal weakness/numbness, diaphoresis but she did have SOB at rest which lasted about 10 minutes.  She tells me that they did orthostatics and this was normal.  They did blood tests including troponin x 2 which were negative.  She was given IVF's and her sinus tachycardia did resolved.  They felt this dizziness was likely orthostatic in nature.  They wanted her to have an outpatient ECHO and heart monitoring done as well.  Today, the dizziness has not recurred and her SOB has not recurred.   I sent her for a 10 day zio patch which was performed and showed a predominant underlying rhythm of sinus rhythm.  She had rare supraventricular ectopy couplets and rare ventricular ectopy couplets.  She does not want to do an ECHO at this time.      Alice Pena returns for followup of depression, anxiety and PTSD.   Over the interim, she is seeing a new therapist which is helping a lot.  Today, she tells me that her anxiety and depression are both mild and controlled.  She denies any panic attacks.  In 2022, her anxiety and depression had worsened where  I added rexulti '1mg'$  daily to her regimen.  She has not taken  trazodone in "a long time".  She denies any suicidal/homicidal ideation.  She is currently on Cymbalta '90mg'$  BID, rexulta '1mg'$  daily, and prazosin '1mg'$  qhs. She has not taken hydroxyzine '25mg'$  po q 4 hrs prn for panic attacks in a long while as well.  Alice Pena was admitted to [facility] due to suicide attempt on 07/21/2019.  She saw a patient in the ER where she states the physicians there made a patient question his DNR/DNI and changed it (he was intubated for COVID-19) and this upsetted [name] to the point that later after work she took a combination of oxycodone, Xanax, gabapentin, ambien and Cymbalta.  She had left over medications and was receiving medications from Dr. Humphrey Rolls due to complications of  chemo induced peripheral neuropathy.  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  She denies any tobacco, alcohol or drug use.    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  This was diagnosed in 2015 where she had a colonoscopy and a mass was found.  She did not undergo surgery but received chemoradiation therapy where her cancer went into remission.  She had relapse with new suspected metastatic site of left supraclavicular mass.  She underwent biopsy of this mass in 05/2017 and this revealed squamous cell carcinoma.  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  She did not undergo surgery but underwent chemo and radiation therapy that was finished in 12/2017.  She was seeing the oncologist every 3 months but stopped about 1 year ago as she did not want treatment if her cancer became active.  The patient currently remains in remission.  She tells me that there was a suspicious image on PET scan of her right ovary in 12/2018.  She was sent to the GYN and they did an exam and workup and they told her there was no signs of cancer.   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  The patient is a 62 year old Caucasian/White female who returns for a regularly scheduled thyroid check. She last saw her endocrinologist who tested her TFT's about 2 weeks ago. She is currently on tirosint 157mg daily. She denies any heat/cold  intolerance, unexplained weight changes, denies faigue, tremors, anxiety, or constipation.       Past Medical History Past Medical History:  Diagnosis Date   Arthritis    Depression    post trauma    Headache(784.0)    History of rectal or anal cancer    Hypothyroidism    Migraines    Neuropathy    Thyroid disease    Tongue cancer (HCC)    Urine incontinence      Allergies Allergies  Allergen Reactions   Morphine And Related Nausea And Vomiting     Medications  Current Outpatient Medications:    brexpiprazole (REXULTI) 1 MG TABS tablet, Take 1 tablet by mouth daily., Disp: , Rfl:    busPIRone (BUSPAR)  10 MG tablet, Take 10 mg by mouth 2 (two) times daily., Disp: , Rfl:    DULoxetine (CYMBALTA) 60 MG capsule, Take 90 mg by mouth 2 (two) times daily., Disp: , Rfl:    hydrOXYzine (VISTARIL) 25 MG capsule, Take 25 mg by mouth every 4 (four) hours as needed., Disp: , Rfl:    Levothyroxine Sodium 112 MCG CAPS, Take 112 mcg by mouth daily., Disp: , Rfl:    prazosin (MINIPRESS) 1 MG capsule, Take 1 capsule by mouth at bedtime., Disp: , Rfl:    trazodone (DESYREL) 300 MG tablet, Take 300 mg by mouth at bedtime., Disp: , Rfl:    WEGOVY 2.4 MG/0.75ML SOAJ, Inject 2.4 mg into the skin once a week., Disp: , Rfl:    Review of Systems Review of Systems  Constitutional:  Negative for chills, diaphoresis, fever and malaise/fatigue.  HENT:  Negative for hearing loss and tinnitus.   Eyes:  Negative for blurred vision and double vision.  Respiratory:  Negative for cough, shortness of breath and wheezing.   Cardiovascular:  Negative for chest pain, palpitations and leg swelling.  Gastrointestinal:  Negative for abdominal pain, blood in stool, constipation, diarrhea, heartburn, melena, nausea and vomiting.  Genitourinary:  Negative for hematuria.  Musculoskeletal:  Negative for back pain and myalgias.  Skin:  Negative for itching and rash.  Neurological:  Negative for dizziness, weakness  and headaches.  Psychiatric/Behavioral:  Negative for depression. The patient is not nervous/anxious and does not have insomnia.        Objective:    Vitals BP 110/70   Pulse 89   Temp 98 F (36.7 C)   Resp 16   Ht 5' (1.524 m)   Wt 121 lb 9.6 oz (55.2 kg)   LMP 01/08/2012   SpO2 91%   BMI 23.75 kg/m    Physical Examination Physical Exam Constitutional:      Appearance: Normal appearance. She is not ill-appearing.  HENT:     Head: Normocephalic and atraumatic.     Right Ear: Tympanic membrane, ear canal and external ear normal.     Left Ear: Tympanic membrane, ear canal and external ear normal.     Nose: Nose normal. No congestion or rhinorrhea.     Mouth/Throat:     Mouth: Mucous membranes are moist.     Pharynx: Oropharynx is clear. No oropharyngeal exudate or posterior oropharyngeal erythema.  Eyes:     General: No scleral icterus.    Conjunctiva/sclera: Conjunctivae normal.     Pupils: Pupils are equal, round, and reactive to light.  Neck:     Vascular: No carotid bruit.  Cardiovascular:     Rate and Rhythm: Normal rate and regular rhythm.     Pulses: Normal pulses.     Heart sounds: No murmur heard.    No friction rub. No gallop.  Pulmonary:     Effort: Pulmonary effort is normal. No respiratory distress.     Breath sounds: No wheezing, rhonchi or rales.  Abdominal:     General: Abdomen is flat. Bowel sounds are normal. There is no distension.     Palpations: Abdomen is soft.     Tenderness: There is no abdominal tenderness.  Musculoskeletal:     Cervical back: Neck supple. No tenderness.     Right lower leg: No edema.     Left lower leg: No edema.  Lymphadenopathy:     Cervical: No cervical adenopathy.  Skin:    General: Skin is warm and  dry.     Findings: No rash.  Neurological:     General: No focal deficit present.     Mental Status: She is alert and oriented to person, place, and time.  Psychiatric:        Mood and Affect: Mood normal.         Behavior: Behavior normal.        Assessment & Plan:   Hypothyroidism She seems euthyroid on exam today.  We will recheck her TFT's today.  PTSD (post-traumatic stress disorder) She is seeing a Scientist, research (life sciences) and this is working out well for her.  History of colon cancer She has a history of colon cancer but she does not want a repeat colonoscopy at this time.  GAD (generalized anxiety disorder) Her anxiety is controlled and she has stopped her hydroxyzine and her trazdone.  She denies any panic attacks today.  Depression She mild recurrent major depression where we  will cotninue her current medications.  BMI 23.0-23.9, adult We will cotninue on wegovy for now.  She is to continue with healthy eating and exercise.  Annual physical exam Health maintenance discussed.  She does not want any more mammograms.  She has eaten already today and we will send an order for labs.    Return in about 3 months (around 10/11/2022).   Townsend Roger, MD

## 2022-07-12 NOTE — Assessment & Plan Note (Signed)
She seems euthyroid on exam today.  We will recheck her TFT's today.

## 2022-07-12 NOTE — Assessment & Plan Note (Signed)
We will cotninue on wegovy for now.  She is to continue with healthy eating and exercise.

## 2022-07-12 NOTE — Assessment & Plan Note (Signed)
Her anxiety is controlled and she has stopped her hydroxyzine and her trazdone.  She denies any panic attacks today.

## 2022-07-12 NOTE — Assessment & Plan Note (Signed)
She has a history of colon cancer but she does not want a repeat colonoscopy at this time.

## 2022-07-16 ENCOUNTER — Other Ambulatory Visit: Payer: Self-pay

## 2022-07-16 MED ORDER — WEGOVY 2.4 MG/0.75ML ~~LOC~~ SOAJ: 2.4000 mg | SUBCUTANEOUS | 2 refills | Status: DC

## 2022-07-18 ENCOUNTER — Other Ambulatory Visit: Payer: Self-pay

## 2022-07-18 DIAGNOSIS — Z0000 Encounter for general adult medical examination without abnormal findings: Secondary | ICD-10-CM

## 2022-07-18 DIAGNOSIS — E039 Hypothyroidism, unspecified: Secondary | ICD-10-CM

## 2022-07-18 DIAGNOSIS — F411 Generalized anxiety disorder: Secondary | ICD-10-CM

## 2022-07-18 DIAGNOSIS — Z6823 Body mass index (BMI) 23.0-23.9, adult: Secondary | ICD-10-CM

## 2022-07-18 DIAGNOSIS — Z85038 Personal history of other malignant neoplasm of large intestine: Secondary | ICD-10-CM

## 2022-07-18 DIAGNOSIS — F32A Depression, unspecified: Secondary | ICD-10-CM

## 2022-07-25 ENCOUNTER — Other Ambulatory Visit: Payer: Self-pay

## 2022-07-25 MED ORDER — WEGOVY 2.4 MG/0.75ML ~~LOC~~ SOAJ: 2.4000 mg | SUBCUTANEOUS | 2 refills | Status: DC

## 2022-09-10 ENCOUNTER — Other Ambulatory Visit: Payer: Self-pay

## 2022-09-10 MED ORDER — LEVOTHYROXINE SODIUM 112 MCG PO CAPS: 112.0000 ug | ORAL_CAPSULE | Freq: Every day | ORAL | 1 refills | Status: DC

## 2022-09-30 ENCOUNTER — Other Ambulatory Visit: Payer: Self-pay

## 2022-09-30 MED ORDER — ONDANSETRON HCL 4 MG PO TABS: 4.0000 mg | ORAL_TABLET | Freq: Three times a day (TID) | ORAL | 1 refills | Status: DC | PRN

## 2022-10-10 ENCOUNTER — Encounter: Payer: Self-pay | Admitting: Internal Medicine

## 2022-10-10 ENCOUNTER — Ambulatory Visit: Payer: Self-pay | Admitting: Internal Medicine

## 2022-10-10 VITALS — BP 120/62 | HR 107 | Temp 97.9°F | Resp 16 | Ht 60.0 in | Wt 106.4 lb

## 2022-10-10 DIAGNOSIS — Z6820 Body mass index (BMI) 20.0-20.9, adult: Secondary | ICD-10-CM | POA: Insufficient documentation

## 2022-10-10 DIAGNOSIS — E7800 Pure hypercholesterolemia, unspecified: Secondary | ICD-10-CM

## 2022-10-10 NOTE — Assessment & Plan Note (Signed)
Her BMI is 20.7 today.  We are going to stop her wegovy.  She is going to continue with lifestyle changes with continued exercise and eat healthy.

## 2022-10-10 NOTE — Progress Notes (Signed)
Office Visit  Subjective   Patient ID: Alice Pena   DOB: 06-10-1961   Age: 62 y.o.   MRN: [id number]   Chief Complaint No chief complaint on file.    History of Present Illness She states a few weeks ago she got a stomach virus where she was having nausea, vomiting and diarrhea.  She states she went to urgent care for fluids at that time.  Today, this is resolved but she states she lost weight during this time period.  Alice Pena returns for followup weight loss management.  She remains on wegovy at 2.4mg  subcut weekly.  She has not taken wegovy for the last 3 weeks.   She was started on wegovy in 08/2021.  She denies any side effects from her wegovy.  The patient continues to eat healthy and she is exercising 4 times per week with walking and doing yoga and bow flex.  She has been on adipex in the past without any problems.    Alice Pena also has a history of hypercholesterolemia which we noted on her last visit 3 months ago when we did her yearly labs.  I asked her at that time to watch diet, cut back fatty foods and to exercise.  She is currently not on any medications.  She denies any myalgias or fatigue.  She currently is on diet and exercise at this time to control her cholesterol.  She is not fasting today.       Past Medical History Past Medical History:  Diagnosis Date   Arthritis    Depression    post trauma    Headache(784.0)    History of rectal or anal cancer    Hypothyroidism    Migraines    Neuropathy    Thyroid disease    Tongue cancer    Urine incontinence      Allergies Allergies  Allergen Reactions   Morphine And Related Nausea And Vomiting     Medications  Current Outpatient Medications:    brexpiprazole (REXULTI) 1 MG TABS tablet, Take 1 tablet (1 mg total) by mouth daily., Disp: 90 tablet, Rfl: 3   DULoxetine (CYMBALTA) 60 MG capsule, Take 90 mg by mouth 2 (two) times daily., Disp: , Rfl:    Levothyroxine Sodium 112 MCG CAPS, Take 1 capsule (112 mcg  total) by mouth daily., Disp: 90 capsule, Rfl: 1   ondansetron (ZOFRAN) 4 MG tablet, Take 1 tablet (4 mg total) by mouth every 8 (eight) hours as needed for nausea or vomiting., Disp: 30 tablet, Rfl: 1   prazosin (MINIPRESS) 1 MG capsule, Take 1 capsule by mouth at bedtime., Disp: , Rfl:    trazodone (DESYREL) 300 MG tablet, Take 300 mg by mouth at bedtime., Disp: , Rfl:    Review of Systems Review of Systems  Constitutional:  Negative for fever.  Respiratory:  Negative for cough and shortness of breath.   Cardiovascular:  Negative for chest pain, palpitations and leg swelling.  Gastrointestinal:  Negative for abdominal pain, blood in stool, constipation, diarrhea, nausea and vomiting.  Musculoskeletal:  Negative for myalgias.  Neurological:  Negative for dizziness, weakness and headaches.       Objective:    Vitals BP 120/62   Pulse (!) 107   Temp 97.9 F (36.6 C)   Resp 16   Ht 5' (1.524 m)   Wt 106 lb 6.4 oz (48.3 kg)   LMP 01/08/2012   SpO2 97%   BMI 20.78 kg/m    Physical  Examination Physical Exam Constitutional:      Appearance: Normal appearance. She is not ill-appearing.  Cardiovascular:     Rate and Rhythm: Normal rate and regular rhythm.     Pulses: Normal pulses.     Heart sounds: No murmur heard.    No friction rub. No gallop.  Pulmonary:     Effort: Pulmonary effort is normal. No respiratory distress.     Breath sounds: No wheezing, rhonchi or rales.  Abdominal:     General: Bowel sounds are normal. There is no distension.     Palpations: Abdomen is soft.     Tenderness: There is no abdominal tenderness.  Musculoskeletal:     Right lower leg: No edema.     Left lower leg: No edema.  Skin:    General: Skin is warm and dry.     Findings: No rash.  Neurological:     Mental Status: She is alert.        Assessment & Plan:   BMI 20.0-20.9, adult Her BMI is 20.7 today.  We are going to stop her wegovy.  She is going to continue with lifestyle  changes with continued exercise and eat healthy.  Hypercholesterolemia We will recheck her cholesterol and thyroid function tests on her next visit.    Return in about 3 months (around 01/09/2023).   Crist Fat, MD

## 2022-10-10 NOTE — Assessment & Plan Note (Signed)
We will recheck her cholesterol and thyroid function tests on her next visit.

## 2022-12-12 ENCOUNTER — Other Ambulatory Visit: Payer: Self-pay

## 2022-12-17 ENCOUNTER — Other Ambulatory Visit: Payer: Self-pay

## 2022-12-17 MED ORDER — TRAZODONE HCL 300 MG PO TABS: 300.0000 mg | ORAL_TABLET | Freq: Every day | ORAL | 1 refills | Status: AC

## 2022-12-17 MED ORDER — DULOXETINE HCL 30 MG PO CPEP: 90.0000 mg | ORAL_CAPSULE | Freq: Two times a day (BID) | ORAL | 1 refills | Status: AC

## 2022-12-24 ENCOUNTER — Ambulatory Visit: Payer: Self-pay | Admitting: Internal Medicine

## 2022-12-25 ENCOUNTER — Ambulatory Visit: Payer: Self-pay | Admitting: Internal Medicine

## 2022-12-25 ENCOUNTER — Encounter: Payer: Self-pay | Admitting: Internal Medicine

## 2022-12-25 VITALS — BP 110/70 | HR 69 | Temp 98.1°F | Resp 16 | Ht 60.0 in | Wt 125.6 lb

## 2022-12-25 DIAGNOSIS — E7800 Pure hypercholesterolemia, unspecified: Secondary | ICD-10-CM

## 2022-12-25 DIAGNOSIS — E039 Hypothyroidism, unspecified: Secondary | ICD-10-CM

## 2022-12-25 DIAGNOSIS — Z6824 Body mass index (BMI) 24.0-24.9, adult: Secondary | ICD-10-CM | POA: Insufficient documentation

## 2022-12-25 MED ORDER — MOUNJARO 2.5 MG/0.5ML ~~LOC~~ SOAJ: 2.5000 mg | SUBCUTANEOUS | 2 refills | Status: DC

## 2022-12-25 NOTE — Assessment & Plan Note (Signed)
We will rechech her FLP today.  She is exercising and watching her diet.

## 2022-12-25 NOTE — Progress Notes (Signed)
Office Visit  Subjective   Patient ID: Alice Pena   DOB: 04/14/1961   Age: 63 y.o.   MRN: [id number]   Chief Complaint Chief Complaint  Patient presents with   Follow-up     History of Present Illness Alice Pena returns for followup weight loss management.  I saw her 3 months ago and we had stopped wegovy at tha time.  She weighed 106 lbs and over the interim she weighs now 125 lb (19 lbs weight gain) despite her walking daily and going to the gym 3 days a week.  She is doing the bow flex and states she is doing very mild weight lifting.  She was on wegovy at 2.4mg  subcut weekly.  She was started on wegovy in 08/2021.  She denies any side effects from her wegovy.  The patient continues to eat healthy and she is exercising 4 times per week with walking and doing yoga and bow flex.  She has been on adipex in the past without any problems.  She states she is eating healty.   Orla also has a history of hypercholesterolemia which we noted on yearly labs in 07/2022.  I asked her at that time to watch diet, cut back fatty foods and to exercise.  She is currently not on any medications.  She denies any myalgias or fatigue.  She currently is on diet and exercise at this time to control her cholesterol.  She is not fasting today.  The patient is a 62 year old Caucasian/White female who returns for a regularly scheduled thyroid check. She last saw her endocrinologist who tested her TFT's about 2 weeks ago. She is currently on tirosint daily. She denies any heat/cold intolerance, unexplained weight changes, denies fatigue, tremors, anxiety, or constipation.   She is having some dry skin.      Past Medical History Past Medical History:  Diagnosis Date   Arthritis    Depression    post trauma    Headache(784.0)    History of rectal or anal cancer    Hypothyroidism    Migraines    Neuropathy    Thyroid disease    Tongue cancer (HCC)    Urine incontinence      Allergies Allergies   Allergen Reactions   Morphine And Codeine Nausea And Vomiting     Medications  Current Outpatient Medications:    brexpiprazole (REXULTI) 1 MG TABS tablet, Take 1 tablet (1 mg total) by mouth daily., Disp: 90 tablet, Rfl: 3   DULoxetine (CYMBALTA) 30 MG capsule, Take 3 capsules (90 mg total) by mouth 2 (two) times daily., Disp: 540 capsule, Rfl: 1   Levothyroxine Sodium 112 MCG CAPS, Take 1 capsule (112 mcg total) by mouth daily., Disp: 90 capsule, Rfl: 1   ondansetron (ZOFRAN) 4 MG tablet, Take 1 tablet (4 mg total) by mouth every 8 (eight) hours as needed for nausea or vomiting., Disp: 30 tablet, Rfl: 1   prazosin (MINIPRESS) 1 MG capsule, Take 1 capsule by mouth at bedtime., Disp: , Rfl:    trazodone (DESYREL) 300 MG tablet, Take 1 tablet (300 mg total) by mouth at bedtime., Disp: 90 tablet, Rfl: 1   Review of Systems Review of Systems  Constitutional:  Negative for chills, fever, malaise/fatigue and weight loss.  Eyes:  Negative for blurred vision and double vision.  Respiratory:  Negative for cough and shortness of breath.   Cardiovascular:  Negative for chest pain, palpitations and leg swelling.  Gastrointestinal:  Negative for abdominal pain, constipation, diarrhea, nausea and vomiting.  Musculoskeletal:  Negative for myalgias.  Skin:  Negative for rash.  Neurological:  Negative for dizziness, weakness and headaches.       Objective:    Vitals BP 110/70   Pulse 69   Temp 98.1 F (36.7 C)   Resp 16   Ht 5' (1.524 m)   Wt 125 lb 9.6 oz (57 kg)   LMP 01/08/2012   SpO2 98%   BMI 24.53 kg/m    Physical Examination Physical Exam Constitutional:      Appearance: Normal appearance. She is not ill-appearing.  Cardiovascular:     Rate and Rhythm: Normal rate and regular rhythm.     Pulses: Normal pulses.     Heart sounds: No murmur heard.    No friction rub. No gallop.  Pulmonary:     Effort: Pulmonary effort is normal. No respiratory distress.     Breath sounds:  No wheezing, rhonchi or rales.  Abdominal:     General: Bowel sounds are normal. There is no distension.     Palpations: Abdomen is soft.     Tenderness: There is no abdominal tenderness.  Musculoskeletal:     Right lower leg: No edema.     Left lower leg: No edema.  Skin:    General: Skin is warm and dry.     Findings: No rash.  Neurological:     Mental Status: She is alert.        Assessment & Plan:   Hypothyroidism She seems to be euthyroid.  We will recheck her TFT's today.  Hypercholesterolemia We will rechech her FLP today.  She is exercising and watching her diet.    BMI 24.0-24.9, adult Despite her diet and exercise, she is gaining weight back.  She is not working with heavy weights and is not seen increased muscle mass.  She wants to restart mounjaro and just maintain her weight.  We will try to get her back on mounjaro and just do 2.5mg  subcut weekly.      Return in about 3 months (around 03/27/2023).   Crist Fat, MD

## 2022-12-25 NOTE — Assessment & Plan Note (Signed)
She seems to be euthyroid.  We will recheck her TFT's today.

## 2022-12-25 NOTE — Assessment & Plan Note (Signed)
Despite her diet and exercise, she is gaining weight back.  She is not working with heavy weights and is not seen increased muscle mass.  She wants to restart mounjaro and just maintain her weight.  We will try to get her back on mounjaro and just do 2.5mg  subcut weekly.

## 2022-12-26 LAB — LIPID PANEL
Chol/HDL Ratio: 2.2 ratio (ref 0.0–4.4)
Cholesterol, Total: 220 mg/dL — ABNORMAL HIGH (ref 100–199)
HDL: 100 mg/dL (ref >39)
LDL Chol Calc (NIH): 111 mg/dL — ABNORMAL HIGH (ref 0–99)
Triglycerides: 52 mg/dL (ref 0–149)
VLDL Cholesterol Cal: 9 mg/dL (ref 5–40)

## 2022-12-26 LAB — TSH: TSH: 0.355 u[IU]/mL — ABNORMAL LOW (ref 0.450–4.500)

## 2022-12-26 LAB — CMP14 + ANION GAP
ALT: 32 IU/L (ref 0–32)
AST: 31 IU/L (ref 0–40)
Albumin: 4.2 g/dL (ref 3.8–4.9)
Alkaline Phosphatase: 80 IU/L (ref 44–121)
Anion Gap: 11 mmol/L (ref 10.0–18.0)
BUN/Creatinine Ratio: 22 (ref 12–28)
BUN: 16 mg/dL (ref 8–27)
Bilirubin Total: <0.2 mg/dL (ref 0.0–1.2)
CO2: 27 mmol/L (ref 20–29)
Calcium: 9.1 mg/dL (ref 8.7–10.3)
Chloride: 102 mmol/L (ref 96–106)
Creatinine, Ser: 0.73 mg/dL (ref 0.57–1.00)
Globulin, Total: 1.8 g/dL (ref 1.5–4.5)
Glucose: 89 mg/dL (ref 70–99)
Potassium: 4.3 mmol/L (ref 3.5–5.2)
Sodium: 140 mmol/L (ref 134–144)
Total Protein: 6 g/dL (ref 6.0–8.5)
eGFR: 94 mL/min/{1.73_m2} (ref >59)

## 2022-12-26 LAB — T4, FREE: Free T4: 1.19 ng/dL (ref 0.82–1.77)

## 2022-12-31 ENCOUNTER — Other Ambulatory Visit: Payer: Self-pay

## 2022-12-31 MED ORDER — LEVOTHYROXINE SODIUM 112 MCG PO CAPS: 112.0000 ug | ORAL_CAPSULE | Freq: Every day | ORAL | 1 refills | Status: DC

## 2022-12-31 MED ORDER — LEVOTHYROXINE SODIUM 112 MCG PO TABS: 112.0000 ug | ORAL_TABLET | Freq: Every day | ORAL | 5 refills | Status: DC

## 2023-01-08 ENCOUNTER — Ambulatory Visit: Payer: Self-pay | Admitting: Internal Medicine

## 2023-03-26 ENCOUNTER — Encounter: Payer: Self-pay | Admitting: Internal Medicine

## 2023-03-26 ENCOUNTER — Ambulatory Visit: Payer: Self-pay | Admitting: Internal Medicine

## 2023-03-26 VITALS — BP 120/74 | HR 93 | Temp 98.5°F | Resp 18 | Ht 60.0 in | Wt 120.4 lb

## 2023-03-26 DIAGNOSIS — E039 Hypothyroidism, unspecified: Secondary | ICD-10-CM

## 2023-03-26 NOTE — Assessment & Plan Note (Signed)
We will recheck her TFT's today and adjust as needed.

## 2023-03-26 NOTE — Progress Notes (Signed)
Office Visit  Subjective   Patient ID: Alice Pena   DOB: September 20, 1960   Age: 62 y.o.   MRN: [id number]   Chief Complaint Chief Complaint  Patient presents with   Follow-up     History of Present Illness The patient is a 62 year old Caucasian/White female who returns for a regularly scheduled thyroid check.  On her last visit, her TSH was slightly abnormal were we continued her on tirosint daily except on Sunday's she takes .  She does not see endocrinology anymore.  She denies any heat intolerance, unexplained weight changes, denies fatigue, tremors, anxiety, or constipation.   She is having some dry skin and is always cold but she attributes this to her past chemotherapy.      Past Medical History Past Medical History:  Diagnosis Date   Arthritis    Depression    post trauma    Headache(784.0)    History of rectal or anal cancer    Hypothyroidism    Migraines    Neuropathy    Thyroid disease    Tongue cancer (HCC)    Urine incontinence      Allergies Allergies  Allergen Reactions   Morphine And Codeine Nausea And Vomiting     Medications  Current Outpatient Medications:    brexpiprazole (REXULTI) 1 MG TABS tablet, Take 1 tablet (1 mg total) by mouth daily., Disp: 90 tablet, Rfl: 3   DULoxetine (CYMBALTA) 30 MG capsule, Take 3 capsules (90 mg total) by mouth 2 (two) times daily., Disp: 540 capsule, Rfl: 1   levothyroxine (SYNTHROID) 112 MCG tablet, Take 1 tablet (112 mcg total) by mouth daily. Except on Sunday take 1/2 tablet, Disp: 30 tablet, Rfl: 5   ondansetron (ZOFRAN) 4 MG tablet, Take 1 tablet (4 mg total) by mouth every 8 (eight) hours as needed for nausea or vomiting., Disp: 30 tablet, Rfl: 1   prazosin (MINIPRESS) 1 MG capsule, Take 1 capsule by mouth at bedtime., Disp: , Rfl:    tirzepatide (MOUNJARO) 2.5 MG/0.5ML Pen, Inject 2.5 mg into the skin once a week., Disp: 4 mL, Rfl: 2   trazodone (DESYREL) 300 MG tablet, Take 1 tablet (300 mg  total) by mouth at bedtime., Disp: 90 tablet, Rfl: 1   Review of Systems Review of Systems  Constitutional:  Negative for chills, fever, malaise/fatigue and weight loss.  Eyes:  Negative for blurred vision and double vision.  Respiratory:  Negative for shortness of breath.   Cardiovascular:  Negative for chest pain and palpitations.  Gastrointestinal:  Negative for abdominal pain, constipation, diarrhea, nausea and vomiting.  Skin:  Negative for itching and rash.  Neurological:  Negative for dizziness, weakness and headaches.       Objective:    Vitals BP 120/74   Pulse 93   Temp 98.5 F (36.9 C)   Resp 18   Ht 5' (1.524 m)   Wt 120 lb 6.4 oz (54.6 kg)   LMP 01/08/2012   SpO2 99%   BMI 23.51 kg/m    Physical Examination Physical Exam Constitutional:      Appearance: Normal appearance. She is not ill-appearing.  Cardiovascular:     Rate and Rhythm: Normal rate and regular rhythm.     Pulses: Normal pulses.     Heart sounds: No murmur heard.    No friction rub. No gallop.  Pulmonary:     Effort: Pulmonary effort is normal. No respiratory distress.     Breath sounds: No  wheezing, rhonchi or rales.  Abdominal:     General: Bowel sounds are normal. There is no distension.     Palpations: Abdomen is soft.     Tenderness: There is no abdominal tenderness.  Musculoskeletal:     Right lower leg: No edema.     Left lower leg: No edema.  Skin:    General: Skin is warm and dry.     Findings: No rash.  Neurological:     Mental Status: She is alert.        Assessment & Plan:   Hypothyroidism We will recheck her TFT's today and adjust as needed.    Return in about 3 months (around 06/25/2023).   Crist Fat, MD

## 2023-03-27 LAB — TSH: TSH: 2.62 u[IU]/mL (ref 0.450–4.500)

## 2023-03-27 LAB — T4, FREE: Free T4: 1.22 ng/dL (ref 0.82–1.77)

## 2023-04-04 NOTE — Progress Notes (Signed)
Her thyroid labs look good.   Patient aware of labs

## 2023-06-16 ENCOUNTER — Ambulatory Visit: Payer: Self-pay | Admitting: Internal Medicine

## 2023-07-16 ENCOUNTER — Other Ambulatory Visit: Payer: Self-pay | Admitting: Internal Medicine

## 2023-07-21 ENCOUNTER — Encounter: Payer: Self-pay | Admitting: Internal Medicine

## 2023-07-21 ENCOUNTER — Ambulatory Visit: Payer: Self-pay | Admitting: Internal Medicine

## 2023-07-21 VITALS — BP 118/78 | HR 75 | Temp 98.4°F | Resp 18 | Ht 60.0 in | Wt 124.0 lb

## 2023-07-21 DIAGNOSIS — E039 Hypothyroidism, unspecified: Secondary | ICD-10-CM

## 2023-07-21 DIAGNOSIS — Z6824 Body mass index (BMI) 24.0-24.9, adult: Secondary | ICD-10-CM

## 2023-07-21 DIAGNOSIS — E7800 Pure hypercholesterolemia, unspecified: Secondary | ICD-10-CM

## 2023-07-21 DIAGNOSIS — Z85810 Personal history of malignant neoplasm of tongue: Secondary | ICD-10-CM | POA: Insufficient documentation

## 2023-07-21 DIAGNOSIS — Z0000 Encounter for general adult medical examination without abnormal findings: Secondary | ICD-10-CM

## 2023-07-21 DIAGNOSIS — F330 Major depressive disorder, recurrent, mild: Secondary | ICD-10-CM

## 2023-07-21 DIAGNOSIS — F4310 Post-traumatic stress disorder, unspecified: Secondary | ICD-10-CM

## 2023-07-21 DIAGNOSIS — F411 Generalized anxiety disorder: Secondary | ICD-10-CM

## 2023-07-21 DIAGNOSIS — E559 Vitamin D deficiency, unspecified: Secondary | ICD-10-CM | POA: Insufficient documentation

## 2023-07-21 DIAGNOSIS — Z85038 Personal history of other malignant neoplasm of large intestine: Secondary | ICD-10-CM

## 2023-07-21 NOTE — Assessment & Plan Note (Signed)
She is on prazosin (minipress) and is followed by psychiatry and she seems to be doing well.

## 2023-07-21 NOTE — Assessment & Plan Note (Signed)
Health maintenance discussed.  She does not want anymore colonoscopies or mammograms.  We will obtain some yearly labs.

## 2023-07-21 NOTE — Assessment & Plan Note (Signed)
This past year we did adjust her thyroid meds.  We will check her TFT's today.  She seems euthryoid.

## 2023-07-21 NOTE — Assessment & Plan Note (Signed)
She is in remission.  She denies any new tongue lesions.

## 2023-07-21 NOTE — Progress Notes (Signed)
Office Visit  Subjective   Patient ID: Alice Pena   DOB: [birth year]   Age: 63 y.o.   MRN: [id number]   Chief Complaint Chief Complaint  Patient presents with   Annual Exam     History of Present Illness Alice Pena is a 63 year old Caucasian/White female who presents for her annual health maintenance exam.  This patient's past medical history Depression, History of Anal cancer, History of Colon Cancer, Hypothyroidism, Neuropathy, and Tongue Cancer.   Her last eye exam was in 03/2023 and she states she not had any changes in her vision but she has blurred vision due to effects from chemotherapy. She had her colonoscopy in 2015 where a mass was found and she was diagnosed with Stage III colon cancer (see below). Today, she states she does not want another colonoscopy due to she would not want treatment if they found anything. Her last mammogram was in 2020 and was normal. She also states she does not want anymore mammograms as she would not undergo treatment if they found anyting. Her last PAP smear was in 01/2019 and it was normal per patient. She does exercise with using a bowflex.  The patient does not smoke.  The patient does get yearly flu vaccines.  She had a pneumovax 23 vaccine in 04/2023.  She had a shingrix vaccine but only the first shot in 03/2022.  She has had 4 COVID-19 vaccines including 2 boosters. There is no family history of heart disease.  The patient is not on an ASA.   The patient is a 63 year old Caucasian/White female who returns for a regularly scheduled thyroid check.  On her last visit, her TSH was slightly abnormal were we continued her on tirosint daily except on Sunday's she takes .  She does not see endocrinology anymore.  She denies any heat intolerance, unexplained weight changes, denies fatigue, tremors, anxiety, or constipation.   She is having some dry skin and is always cold but she attributes this to her past chemotherapy.  We also have  been following Alice Pena for weight loss management.  I saw her 6 months ago and we had stopped wegovy at that time.  She weighed 106 lbs about 6 months ago and today she weighs 124 lbs.  She is doing the bow flex and states she is doing very mild weight lifting.  She was on wegovy at 2.4mg  subcut weekly but again this was stopped.  She was started on wegovy in 08/2021.  She denies any side effects from her wegovy.  The patient continues to eat healthy and she is exercising 4 times per week with walking and doing yoga and bow flex.  She has been on adipex in the past without any problems.  She states she is eating healty.   Alice Pena also has a history of hypercholesterolemia which we noted on yearly labs in 07/2022.  I asked her at that time to watch diet, cut back fatty foods and to exercise.  She is currently not on any medications.  She denies any myalgias or fatigue.  She currently is on diet and exercise at this time to control her cholesterol.  She is not fasting today.  The patient was seen in the ER in 01/2022 for a near syncope episode at work.  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  This occurred as  well when she started walking down the hallway.  She presented to the ER where an EKG showed sinus tachycardia.  She did not endorse any chest pain, palpitations, focal weakness/numbness, diaphoresis but she did have SOB at rest which lasted about 10 minutes.  She tells me that they did orthostatics and this was normal.  They did blood tests including troponin x 2 which were negative.  She was given IVF's and her sinus tachycardia did resolved.  They felt this dizziness was likely orthostatic in nature.  They wanted her to have an outpatient ECHO and heart monitoring done as well.  Today, the dizziness has not recurred and her SOB has not recurred.   I sent her for a 10 day zio patch which was performed and showed a predominant  underlying rhythm of sinus rhythm.  She had rare supraventricular ectopy couplets and rare ventricular ectopy couplets.  She does not want to do an ECHO at this time.      Alice Pena returns for followup of depression, anxiety and PTSD.   Over the interim, she is seeing a new therapist which is helping a lot.  Today, she tells me that her anxiety and depression are both mild and controlled.  She sees psychiatry as well as therapy and ketamine infusions per their service.  She denies any panic attacks.  In 2022, her anxiety and depression had worsened where  I added rexulti 1mg  daily to her regimen.  She is currently on cymbalta 90mg  BID, trazodone 300mg  at bedtime for sleep and rexult 1mg  daily, and minipress at bedtime for PTSD.  She denies any suicidal/homicidal ideation.   She has not taken hydroxyzine 25mg  po q 4 hrs prn for panic attacks with her last use around CHristmasl.  Alice Pena was admitted to [facility] due to suicide attempt on 07/21/2019.  She saw a patient in the ER where she states the physicians there made a patient question his DNR/DNI and changed it (he was intubated for COVID-19) and this upsetted Alice Pena to the point that later after work she took a combination of oxycodone, Xanax, gabapentin, ambien and Cymbalta.  She had left over medications and was receiving medications from Dr. Welton Flakes due to complications of chemo induced peripheral neuropathy.  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  She denies any tobacco, alcohol or drug use.    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  This was diagnosed in 2015 where she had a colonoscopy and a  mass was found.  She did not undergo surgery but received chemoradiation therapy where her cancer went into remission.  She had relapse with new suspected metastatic site of left supraclavicular mass.  She underwent biopsy of this mass in 05/2017 and this revealed squamous cell carcinoma.  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  She did not undergo surgery but underwent chemo and radiation therapy that was finished in 12/2017.  She was seeing the oncologist every 3 months but stopped going around  2022 as she did not want treatment if her cancer became active.  The patient currently remains in remission.  She tells me that there was a suspicious image on PET scan of her right ovary in 12/2018.  She was sent to the GYN and they did an exam and workup and they told her there was no signs of cancer.   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  She does have sequalae of lymphedema with in her neck and face/cheeks which is due to her radiation therapy.  She does do massage which do help.       Past Medical History Past Medical History:  Diagnosis Date   Arthritis    Depression    post trauma    Headache(784.0)    History of rectal or anal cancer    Hypothyroidism    Migraines    Neuropathy    Thyroid disease    Tongue cancer (HCC)    Urine incontinence      Allergies Allergies  Allergen Reactions   Morphine And Codeine Nausea And Vomiting     Medications  Current Outpatient Medications:    brexpiprazole (REXULTI) 1 MG TABS tablet, Take 1 tablet (1 mg total) by mouth daily., Disp: 90 tablet, Rfl: 3   DULoxetine (CYMBALTA) 30 MG capsule, Take 3 capsules (90 mg total) by mouth 2 (two) times daily., Disp: 540 capsule, Rfl: 1   levothyroxine (SYNTHROID)  112 MCG tablet, Take 1 tablet (112 mcg total) by mouth daily. Except on Sunday take 1/2 tablet, Disp: 30 tablet, Rfl: 5   prazosin (MINIPRESS) 1 MG capsule, Take 1 capsule by mouth at bedtime., Disp: , Rfl:    trazodone (DESYREL) 300 MG tablet, Take 1 tablet (300 mg total) by mouth at bedtime., Disp: 90 tablet, Rfl: 1   Review of Systems Review of Systems  Constitutional:  Negative for chills, fever and malaise/fatigue.  Eyes:  Negative for blurred vision and double vision.  Respiratory:  Negative for cough, hemoptysis, shortness of breath and wheezing.   Cardiovascular:  Negative for chest pain, palpitations and leg swelling.  Gastrointestinal:  Negative for abdominal pain, blood in stool, constipation, diarrhea, heartburn, melena, nausea and vomiting.  Genitourinary:  Negative for frequency and hematuria.  Musculoskeletal:  Negative for myalgias.  Skin:  Positive for itching. Negative for rash.  Neurological:  Negative for dizziness, weakness and headaches.  Endo/Heme/Allergies:  Negative for polydipsia.  Psychiatric/Behavioral:  Negative for depression, substance abuse and suicidal ideas. The patient is not nervous/anxious and does not have insomnia.        Objective:    Vitals BP 118/78   Pulse 75   Temp 98.4 F (36.9 C)   Resp 18   Ht 5' (1.524 m)   Wt 124 lb (56.2 kg)   LMP 01/08/2012   SpO2 98%   BMI 24.22 kg/m    Physical Examination Physical Exam Constitutional:      Appearance: Normal appearance. She is not ill-appearing.  HENT:     Head: Normocephalic and atraumatic.     Right Ear: Tympanic membrane, ear canal and external ear normal.     Left Ear: Tympanic membrane, ear canal and external ear normal.     Nose: Nose normal. No congestion or rhinorrhea.     Mouth/Throat:     Mouth: Mucous membranes are moist.     Pharynx: Oropharynx is clear. No oropharyngeal exudate or posterior oropharyngeal  erythema.  Eyes:     General: No scleral icterus.     Conjunctiva/sclera: Conjunctivae normal.     Pupils: Pupils are equal, round, and reactive to light.  Neck:     Vascular: No carotid bruit.  Cardiovascular:     Rate and Rhythm: Normal rate and regular rhythm.     Pulses: Normal pulses.     Heart sounds: No murmur heard.    No friction rub. No gallop.  Pulmonary:     Effort: Pulmonary effort is normal. No respiratory distress.     Breath sounds: No wheezing, rhonchi or rales.  Abdominal:     General: Bowel sounds are normal. There is no distension.     Palpations: Abdomen is soft.     Tenderness: There is no abdominal tenderness.  Musculoskeletal:     Cervical back: No tenderness.     Right lower leg: No edema.     Left lower leg: No edema.  Lymphadenopathy:     Cervical: No cervical adenopathy.  Skin:    General: Skin is warm and dry.     Findings: No rash.  Neurological:     General: No focal deficit present.     Mental Status: She is alert and oriented to person, place, and time.  Psychiatric:        Mood and Affect: Mood normal.        Behavior: Behavior normal.        Assessment & Plan:   Hypothyroidism This past year we did adjust her thyroid meds.  We will check her TFT's today.  She seems euthryoid.  Hypercholesterolemia Her cholesterol has been elevated in the past.  She does not need medications for this as it is mild.  I want her to eat healthy and continue to exercise.  PTSD (post-traumatic stress disorder) She is on prazosin (minipress) and is followed by psychiatry and she seems to be doing well.  History of tongue cancer She is in remission.  She denies any new tongue lesions.  History of colon cancer She has not seen her oncologist in about 2 years but does not want to go back.  Her colon cancer is in remission.  We will continue to follow.  GAD (generalized anxiety disorder) Her GAD with panic attacks is mild and controlled.  Depression She has mild recurrent major depression that is controlled  on her medications.  She does see psychiatry and counselling and does ketamine injections as well.    BMI 24.0-24.9, adult Continue to eat healthy and exercise.  Annual physical exam Health maintenance discussed.  She does not want anymore colonoscopies or mammograms.  We will obtain some yearly labs.    Return in about 3 months (around 10/19/2023).   Crist Fat, MD

## 2023-07-21 NOTE — Assessment & Plan Note (Signed)
Her cholesterol has been elevated in the past.  She does not need medications for this as it is mild.  I want her to eat healthy and continue to exercise.

## 2023-07-21 NOTE — Assessment & Plan Note (Signed)
She has not seen her oncologist in about 2 years but does not want to go back.  Her colon cancer is in remission.  We will continue to follow.

## 2023-07-21 NOTE — Assessment & Plan Note (Signed)
Her GAD with panic attacks is mild and controlled.

## 2023-07-21 NOTE — Assessment & Plan Note (Signed)
She has mild recurrent major depression that is controlled on her medications.  She does see psychiatry and counselling and does ketamine injections as well.

## 2023-07-21 NOTE — Assessment & Plan Note (Signed)
Continue to eat healthy and exercise. 

## 2023-08-05 LAB — CMP14 + ANION GAP
ALT: 24 [IU]/L (ref 0–32)
AST: 21 [IU]/L (ref 0–40)
Albumin: 4 g/dL (ref 3.9–4.9)
Alkaline Phosphatase: 58 [IU]/L (ref 44–121)
Anion Gap: 13 mmol/L (ref 10.0–18.0)
BUN/Creatinine Ratio: 21 (ref 12–28)
BUN: 17 mg/dL (ref 8–27)
Bilirubin Total: 0.3 mg/dL (ref 0.0–1.2)
CO2: 25 mmol/L (ref 20–29)
Calcium: 8.9 mg/dL (ref 8.7–10.3)
Chloride: 102 mmol/L (ref 96–106)
Creatinine, Ser: 0.82 mg/dL (ref 0.57–1.00)
Globulin, Total: 1.7 g/dL (ref 1.5–4.5)
Glucose: 78 mg/dL (ref 70–99)
Potassium: 4.2 mmol/L (ref 3.5–5.2)
Sodium: 140 mmol/L (ref 134–144)
Total Protein: 5.7 g/dL — ABNORMAL LOW (ref 6.0–8.5)
eGFR: 81 mL/min/{1.73_m2} (ref >59)

## 2023-08-05 LAB — CBC WITH DIFFERENTIAL/PLATELET
Basophils Absolute: 0 10*3/uL (ref 0.0–0.2)
Basos: 1 %
EOS (ABSOLUTE): 0.1 10*3/uL (ref 0.0–0.4)
Eos: 2 %
Hematocrit: 35.2 % (ref 34.0–46.6)
Hemoglobin: 11.4 g/dL (ref 11.1–15.9)
Immature Grans (Abs): 0 10*3/uL (ref 0.0–0.1)
Immature Granulocytes: 0 %
Lymphocytes Absolute: 0.7 10*3/uL (ref 0.7–3.1)
Lymphs: 25 %
MCH: 30.4 pg (ref 26.6–33.0)
MCHC: 32.4 g/dL (ref 31.5–35.7)
MCV: 94 fL (ref 79–97)
Monocytes Absolute: 0.4 10*3/uL (ref 0.1–0.9)
Monocytes: 14 %
Neutrophils Absolute: 1.6 10*3/uL (ref 1.4–7.0)
Neutrophils: 58 %
Platelets: 201 10*3/uL (ref 150–450)
RBC: 3.75 x10E6/uL — ABNORMAL LOW (ref 3.77–5.28)
RDW: 12.9 % (ref 11.7–15.4)
WBC: 2.8 10*3/uL — ABNORMAL LOW (ref 3.4–10.8)

## 2023-08-05 LAB — LIPID PANEL
Chol/HDL Ratio: 2.6 {ratio} (ref 0.0–4.4)
Cholesterol, Total: 211 mg/dL — ABNORMAL HIGH (ref 100–199)
HDL: 80 mg/dL (ref >39)
LDL Chol Calc (NIH): 119 mg/dL — ABNORMAL HIGH (ref 0–99)
Triglycerides: 66 mg/dL (ref 0–149)
VLDL Cholesterol Cal: 12 mg/dL (ref 5–40)

## 2023-08-05 LAB — HEMOGLOBIN A1C
Est. average glucose Bld gHb Est-mCnc: 105 mg/dL
Hgb A1c MFr Bld: 5.3 % (ref 4.8–5.6)

## 2023-08-05 LAB — VITAMIN D 25 HYDROXY (VIT D DEFICIENCY, FRACTURES): Vit D, 25-Hydroxy: 100 ng/mL (ref 30.0–100.0)

## 2023-08-05 LAB — TSH: TSH: 0.122 u[IU]/mL — ABNORMAL LOW (ref 0.450–4.500)

## 2023-08-05 LAB — T4, FREE: Free T4: 1.49 ng/dL (ref 0.82–1.77)

## 2023-08-18 NOTE — Progress Notes (Signed)
Her labs look good. Her TSH was low but her free T4 is normal. I am not going to change any of her meds.  Patient aware. Left detailed message on recorder

## 2023-10-13 ENCOUNTER — Encounter: Payer: Self-pay | Admitting: Internal Medicine

## 2023-10-13 ENCOUNTER — Ambulatory Visit: Payer: Self-pay | Admitting: Internal Medicine

## 2023-10-13 VITALS — BP 118/68 | HR 103 | Temp 98.1°F | Resp 17 | Ht 61.0 in | Wt 121.2 lb

## 2023-10-13 DIAGNOSIS — F330 Major depressive disorder, recurrent, mild: Secondary | ICD-10-CM

## 2023-10-13 DIAGNOSIS — F411 Generalized anxiety disorder: Secondary | ICD-10-CM

## 2023-10-13 DIAGNOSIS — E039 Hypothyroidism, unspecified: Secondary | ICD-10-CM

## 2023-10-13 MED ORDER — LEVOTHYROXINE SODIUM 112 MCG PO TABS: 112.0000 ug | ORAL_TABLET | Freq: Every day | ORAL | 3 refills | Status: DC

## 2023-10-13 NOTE — Assessment & Plan Note (Signed)
 She is euthyroid.  We will recheck her TFT's today.

## 2023-10-13 NOTE — Progress Notes (Signed)
 Office Visit  Subjective   Patient ID: Alice Pena   DOB: 1961/07/01   Age: 63 y.o.   MRN: [id number]   Chief Complaint Chief Complaint  Patient presents with   Follow-up     History of Present Illness The patient is a 63 year old Caucasian/White female who returns for a regularly scheduled thyroid check.  On her last visit, her TSH was slightly abnormal and her Free T4 was normal rnage.  We continued her on tirosint daily except on Sunday's she takes .  She does not see endocrinology anymore.  She denies any heat intolerance, unexplained weight changes, denies fatigue, tremors, anxiety, or constipation.   She is having some dry skin and is always cold but she attributes this to her past chemotherapy.  Alice Pena returns for followup of depression, anxiety and PTSD.   Over the interim, there has been no change to her anxiety or depression.  She is still seeing a new therapist which is helping a lot.  Today, she tells me that her anxiety and depression are both mild and controlled.  She sees psychiatry as well as therapy and ketamine infusions per their service.  She denies any panic attacks.  In 2022, her anxiety and depression had worsened where  I added rexulti 1mg  daily to her regimen.  She is currently on cymbalta 90mg  BID, trazodone 300mg  at bedtime for sleep and rexult 1mg  daily, and minipress at bedtime for PTSD.  She denies any suicidal/homicidal ideation.   She has not taken hydroxyzine 25mg  po q 4 hrs prn for panic attacks with her last use around [place].  [name] was admitted to [facility] due to suicide attempt on 07/21/2019.  She saw a patient in the ER where she states the physicians there made a patient question his DNR/DNI and changed it (he was intubated for COVID-19) and this upsetted Alice Pena to the point that later after work she took a combination of oxycodone, Xanax, gabapentin, ambien and Cymbalta.  She had left over medications and was receiving medications  from Dr. Welton Flakes due to complications of chemo induced peripheral neuropathy.  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  She denies any tobacco, alcohol or drug use.     Past Medical History Past Medical History:  Diagnosis Date   Arthritis    Depression    post trauma    Headache(784.0)    History of rectal or anal cancer    Hypothyroidism    Migraines    Neuropathy    Thyroid disease    Tongue cancer (HCC)    Urine incontinence      Allergies Allergies  Allergen Reactions   Morphine And Codeine Nausea And Vomiting     Medications  Current Outpatient Medications:    brexpiprazole (REXULTI) 1 MG TABS tablet, Take 1 tablet (1 mg total) by mouth daily., Disp: 90 tablet, Rfl: 3   DULoxetine (CYMBALTA) 30 MG capsule, Take 3 capsules (90 mg total) by mouth 2 (two) times daily., Disp: 540 capsule, Rfl: 1   levothyroxine (SYNTHROID) 112 MCG tablet, Take 1 tablet (112 mcg total) by mouth daily. Except on Sunday take 1/2 tablet, Disp: 30 tablet, Rfl: 5   prazosin (  MINIPRESS) 1 MG capsule, Take 1 capsule by mouth at bedtime., Disp: , Rfl:    trazodone (DESYREL) 300 MG tablet, Take 1 tablet (300 mg total) by mouth at bedtime., Disp: 90 tablet, Rfl: 1   Review of Systems Review of Systems  Constitutional:  Negative for chills, fever and malaise/fatigue.  Eyes:  Negative for blurred vision and double vision.  Respiratory:  Negative for shortness of breath.   Cardiovascular:  Negative for chest pain, palpitations and leg swelling.  Gastrointestinal:  Negative for abdominal pain, constipation, diarrhea, nausea and vomiting.  Skin:  Negative for rash.  Neurological:  Negative for dizziness, weakness and headaches.        Objective:    Vitals BP 118/68   Pulse (!) 103   Temp 98.1 F (36.7 C)   Resp 17   Ht 5\' 1"  (1.549 m)   Wt 121 lb 3.2 oz (55 kg)   LMP 01/08/2012   SpO2 95%   BMI 22.90 kg/m    Physical Examination Physical Exam Constitutional:      Appearance: Normal appearance. She is not ill-appearing.  Neck:     Vascular: No carotid bruit.     Comments: No thyromegaly or nodules felt on thyroid. Cardiovascular:     Rate and Rhythm: Normal rate and regular rhythm.     Pulses: Normal pulses.     Heart sounds: No murmur heard.    No friction rub. No gallop.  Pulmonary:     Effort: Pulmonary effort is normal. No respiratory distress.     Breath sounds: No wheezing, rhonchi or rales.  Abdominal:     General: Bowel sounds are normal. There is no distension.     Palpations: Abdomen is soft.     Tenderness: There is no abdominal tenderness.  Musculoskeletal:     Cervical back: Neck supple. No tenderness.     Right lower leg: No edema.     Left lower leg: No edema.  Lymphadenopathy:     Cervical: No cervical adenopathy.  Skin:    General: Skin is warm and dry.     Findings: No rash.  Neurological:     Mental Status: She is alert.        Assessment & Plan:   Hypothyroidism She is euthyroid.  We will recheck her TFT's today.  GAD (generalized anxiety disorder) This is controlled.  We discussed her medications and she is compliant on them and doing well. She is still talking to her therapist.  Depression Plan as above.    Return in about 4 months (around 02/12/2024).   Alice Haines, MD

## 2023-10-13 NOTE — Assessment & Plan Note (Signed)
 Plan as above.

## 2023-10-13 NOTE — Assessment & Plan Note (Signed)
 This is controlled.  We discussed her medications and she is compliant on them and doing well. She is still talking to her therapist.

## 2023-10-15 ENCOUNTER — Other Ambulatory Visit: Payer: Self-pay | Admitting: Internal Medicine

## 2023-10-16 LAB — TSH: TSH: 0.727 u[IU]/mL (ref 0.450–4.500)

## 2023-10-16 LAB — T4, FREE: Free T4: 1.33 ng/dL (ref 0.82–1.77)

## 2023-11-14 ENCOUNTER — Ambulatory Visit: Payer: Self-pay

## 2023-11-14 NOTE — Progress Notes (Signed)
 Tell her that her thyroid  function tests look good.  Patient aware

## 2024-01-29 ENCOUNTER — Ambulatory Visit: Payer: Self-pay | Admitting: Internal Medicine

## 2024-01-29 ENCOUNTER — Encounter: Payer: Self-pay | Admitting: Internal Medicine

## 2024-01-29 VITALS — BP 108/66 | HR 84 | Temp 97.9°F | Resp 16 | Ht 59.0 in | Wt 121.8 lb

## 2024-01-29 DIAGNOSIS — E039 Hypothyroidism, unspecified: Secondary | ICD-10-CM

## 2024-01-29 DIAGNOSIS — F330 Major depressive disorder, recurrent, mild: Secondary | ICD-10-CM

## 2024-01-29 DIAGNOSIS — F411 Generalized anxiety disorder: Secondary | ICD-10-CM

## 2024-01-29 MED ORDER — LEVOTHYROXINE SODIUM 112 MCG PO TABS: 112.0000 ug | ORAL_TABLET | Freq: Every day | ORAL | 3 refills | Status: AC

## 2024-01-29 NOTE — Progress Notes (Signed)
 Office Visit  Subjective   Patient ID: Alice Pena   DOB: 13-Jan-1961   Age: 63 y.o.   MRN: [id number]   Chief Complaint Chief Complaint  Patient presents with   Follow-up    4 month follow up     History of Present Illness Alice Pena returns for followup of depression, anxiety and PTSD.   Over the interim, there has been no change to her anxiety or depression and both remain mild and controlled.  She is currently still working at [facility] at this time.  She is still seeing a new therapist which is helping a lot.  She did her ketamine infusion at the beginning of the month and she is talking to her therapist once a month.   She denies any panic attacks.  In 2022, her anxiety and depression had worsened where  I added rexulti  1mg  daily to her regimen.  She is currently on cymbalta  90mg  BID, trazodone  300mg  at bedtime for sleep and rexult 1mg  daily, and minipress 1mg  at bedtime for PTSD.  She denies any suicidal/homicidal ideation.   She has not taken hydroxyzine 25mg  po q 4 hrs prn for panic attacks with her last use around [facility].  Alice Pena was admitted to [facility] due to suicide attempt on 07/21/2019.  She saw a patient in the ER where she states the physicians there made a patient question his DNR/DNI and changed it (he was intubated for COVID-19) and this upsetted Alice Pena to the point that later after work she took a combination of oxycodone, Xanax , gabapentin, ambien  and Cymbalta .  She had left over medications and was receiving medications from Dr. Fernand due to complications of chemo induced peripheral neuropathy.  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  She denies any tobacco, alcohol or drug use.     Past  Medical History Past Medical History:  Diagnosis Date   Arthritis    Depression    post trauma    Headache(784.0)    History of rectal or anal cancer    Hypothyroidism    Migraines    Neuropathy    Thyroid  disease    Tongue cancer (HCC)    Urine incontinence      Allergies Allergies  Allergen Reactions   Morphine And Codeine Nausea And Vomiting     Medications  Current Outpatient Medications:    brexpiprazole  (REXULTI ) 1 MG TABS tablet, Take 1 tablet (1 mg total) by mouth daily., Disp: 90 tablet, Rfl: 3   DULoxetine  (CYMBALTA ) 30 MG capsule, Take 3 capsules (90 mg total) by mouth 2 (two) times daily., Disp: 540 capsule, Rfl: 1   levothyroxine  (SYNTHROID ) 112 MCG tablet, Take 1 tablet (112 mcg total) by mouth daily. Except on Sunday take 1/2 tablet, Disp: 90 tablet, Rfl: 3   prazosin (MINIPRESS) 1 MG capsule, Take 1 capsule by mouth at bedtime., Disp: , Rfl:    trazodone  (DESYREL ) 300 MG tablet, Take 1 tablet (300 mg total) by mouth at bedtime., Disp: 90 tablet, Rfl: 1   Review of Systems Review of Systems  Constitutional:  Negative for chills and fever.  Eyes:  Negative for blurred vision.  Respiratory:  Negative for shortness of breath.   Cardiovascular:  Negative for chest pain and palpitations.  Gastrointestinal:  Negative for abdominal pain, constipation, diarrhea, nausea and vomiting.  Neurological:  Negative for dizziness, weakness and headaches.  Psychiatric/Behavioral:  Negative for substance abuse and suicidal ideas. The patient does not have insomnia.        Objective:    Vitals BP 108/66   Pulse 84   Temp 97.9 F (36.6 C) (Temporal)   Resp 16   Ht 4' 11 (1.499 m)   Wt 121 lb 12.8 oz (55.2 kg)   LMP 01/08/2012   SpO2 98%   BMI 24.60 kg/m    Physical Examination Physical Exam Constitutional:      Appearance: Normal appearance. She is not ill-appearing.  Cardiovascular:     Rate and Rhythm: Normal rate and regular rhythm.     Pulses: Normal  pulses.     Heart sounds: No murmur heard.    No friction rub. No gallop.  Pulmonary:     Effort: Pulmonary effort is normal. No respiratory distress.     Breath sounds: No wheezing, rhonchi or rales.  Abdominal:     General: Bowel sounds are normal. There is no distension.     Palpations: Abdomen is soft.     Tenderness: There is no abdominal tenderness.  Musculoskeletal:     Right lower leg: No edema.     Left lower leg: No edema.  Skin:    General: Skin is warm and dry.     Findings: No rash.  Neurological:     Mental Status: She is alert.        Assessment & Plan:   Hypothyroidism We will check her TFT's on her next visit.  Depression The patient has mild recurrent major depression with GAD that is currently controlled.  She is sees a psychiatric NP for her meds and another NP for her ketamine infusion.  She is also seeing a therapist at this time and she seems to be doing well.    Return in about 3 months (around 04/30/2024).   Alice Fleeta Finger, MD

## 2024-01-29 NOTE — Assessment & Plan Note (Signed)
 The patient has mild recurrent major depression with GAD that is currently controlled.  She is sees a psychiatric NP for her meds and another NP for her ketamine infusion.  She is also seeing a therapist at this time and she seems to be doing well.

## 2024-01-29 NOTE — Assessment & Plan Note (Signed)
 We will check her TFT's on her next visit.

## 2024-02-09 ENCOUNTER — Ambulatory Visit: Payer: Self-pay | Admitting: Internal Medicine

## 2024-04-20 NOTE — Progress Notes (Signed)
 HPI: Pleasant 63 year old female with a past medical history for cancer requiring radiation and chemotherapy who presents today with a complaint of right greater than left hip pain.  Patient reports symptoms have progressed for the past year.  No specific trauma or injury.  Pain feels deep to the lateral hip and will radiate to the groin on the right side.  She does report intermittent catching sensation and locking sensation with the right hip.  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 Marital status: Single    Spouse name: Not on file  . Number of children: Not on file  . Years of education: Not on file  . Highest education level: Not on file  Occupational History  . Not on file  Tobacco Use  . Smoking status: Never  . Smokeless tobacco: Never  Substance and Sexual Activity  . Alcohol use: No  . Drug use: No  . Sexual activity: Not on file  Other Topics Concern  . Not on file  Social History Narrative  . Not on file   Social Drivers of Health   Food Insecurity: Not on file  Transportation Needs: Not on file  Safety: Not on file  Living Situation: Not on file   Family History[5]  Complete ROS has been performed and is negative except as stated above  Vitals:   04/20/24 0955  BP: 122/70  Pulse: 78   Alert and oriented x 3.  No acute distress at rest.  Appropriate mood.  Nonantalgic gait  Neurovasc intact lower extremities Leg lengths are appropriate  Left hip range of motion is painful with internal rotation.  Approximate 15 degrees.  External rotation 30 degrees without pain.  Flexion 95 degrees with stiffness without pain.  Equivocal Faber's.  Positive hip scour.  No instability or locking appreciated.  Right hip range  of motion is 20 degrees of external rotation 15 degrees of internal rotation.  External rotation recreates pain.  Flexion 90 degrees with stiffness and mild discomfort.  Positive hip scour, Faber's.  Negative straight leg raise Pain with AB and adduction Strength overall appropriate bilateral lower extremities  X-rays to the pelvis reviewed with the patient moderate right hip DJD noted and mild to moderate left hip DJD noted.  Hips are located.  No acute fracture.  Positive L-spine DDD noted.  Positive SI joint DJD.  Osteopenia  Personal interpretation  Assessment 1. Primary osteoarthritis of both hips       P: -- We reviewed the radiographs and exam findings.  Exam and radiographs are consistent with arthritis, right greater than left.  We agreed to begin formal physical therapy.  Patient follow-up in 6 to 8 weeks for recheck.  If her symptoms are not better we may consider additional imaging.  I have concern about intra-articular steroid injections due to the patient's history of cancer and undergoing radiation and chemotherapy.  We did discuss the benefit of a bone density scan and she will discuss this with her PCP      [1] Past Medical History: Diagnosis Date  . Anal cancer    (CMD) 2015  . Decreased oral intake 11/18/2017  . Depression   . Dysphagia   . Hypothyroidism   . Neuropathy    hands/feet.  Persistent grade 2.  . Suicide  attempt 2021  . Tongue cancer 2019  [2] Past Surgical History: Procedure Laterality Date  . CARPAL TUNNEL RELEASE Bilateral    Procedure: CARPAL TUNNEL RELEASE  . CESAREAN SECTION W/ TUBAL LIGATION  1989   Procedure: CESAREAN SECTION W/ TUBAL LIGATION  . FINGER GANGLION CYST EXCISION Bilateral 03/23/2020   Procedure: EXCISION CYST / GANGLION FINGER - mucous cyst excision bilateral index fingers;  Surgeon: Alan Idell Stade, MD;  Location: [facility] MAIN OR;  Service: Orthopedics;  Laterality: Bilateral;  . LARYNGOSCOPY N/A 09/11/2017    Procedure: LARYNGOSCOPY DIRECT;  Surgeon: Fonda Laraine Muscat, MD;  Location: [facility] OUTPATIENT OR;  Service: ENT;  Laterality: N/A;  FK retractor  . OTHER SURGICAL HISTORY     Procedure: OTHER SURGICAL HISTORY (breast reduction)  [3]  Current Outpatient Medications:  .  ALPRAZolam  (XANAX ) 0.25 mg tablet, Take 1-2 tabs as needed for anxiety, Disp: 10 tablet, Rfl: 0 .  brexpiprazole  (Rexulti ) 1 mg tab tablet, Take 1 mg by mouth Once Daily. (Patient not taking: Reported on 10/01/2022), Disp: 30 tablet, Rfl: 0 .  busPIRone (BUSPAR) 10 mg tablet, TAKE 2 TABLETS BY MOUTH 3 TIMES DAILY, Disp: 180 tablet, Rfl: 0 .  busPIRone (BUSPAR) 10 mg tablet, take 2 tab po TID (Patient not taking: Reported on 10/01/2022), Disp: 540 tablet, Rfl: 3 .  busPIRone (BUSPAR) 10 mg tablet, Take 20 mg by mouth 3 (three) times a day. (Patient not taking: Reported on 10/01/2022), Disp: 540 tablet, Rfl: 3 .  busPIRone (BUSPAR) 10 mg tablet, take 2 tab po TID (Patient not taking: Reported on 10/01/2022), Disp: 540 tablet, Rfl: 3 .  busPIRone (BUSPAR) 10 mg tablet, take 2 tab po TID (Patient not taking: Reported on 10/01/2022), Disp: 540 tablet, Rfl: 3 .  busPIRone (BUSPAR) 10 mg tablet, Take 2 tablets (20 mg total) by mouth 3 (three) times a day., Disp: 540 tablet, Rfl: 3 .  busPIRone (BUSPAR) 10 mg tablet, take 2 tab po TID, Disp: 540 tablet, Rfl: 3 .  DULoxetine  (CYMBALTA ) 30 mg capsule, Take 90 mg by mouth 2 (two) times a day., Disp: 540 capsule, Rfl: 3 .  DULoxetine  (CYMBALTA ) 30 mg capsule, Take 90 mg by mouth 2 (two) times a day. (Patient not taking: Reported on 10/01/2022), Disp: 540 capsule, Rfl: 3 .  DULoxetine  (CYMBALTA ) 30 mg capsule, Take 3 capsules (90 mg total) by mouth 2 times daily., Disp: 540 capsule, Rfl: 3 .  DULoxetine  (CYMBALTA ) 30 mg capsule, Take 3 capsules (90 mg total) by mouth 2 (two) times daily., Disp: 540 capsule, Rfl: 1 .  DULoxetine  (Cymbalta ) 30 mg capsule, 3 capsule twice daily (total 90 mg twice daily),  Disp: 540 capsule, Rfl: 3 .  DULoxetine  (Cymbalta ) 30 mg capsule, 3 capsule twice daily (total 90 mg twice daily), Disp: 540 capsule, Rfl: 3 .  ergocalciferol  (VITAMIN D2) 1,250 mcg (50,000 unit) capsule, take one capsule once a week, Disp: 12 capsule, Rfl: 1 .  hydrOXYzine (VISTARIL) 25 mg capsule, Take 25 mg by mouth every 4 (four) hours as needed. (Patient not taking: Reported on 10/01/2022), Disp: 540 capsule, Rfl: 3 .  hydrOXYzine pamoate (VISTARIL) 25 mg capsule, Take 1 capsule every 4 hours as needed for anxiety, Disp: 540 capsule, Rfl: 3 .  hydrOXYzine pamoate (VISTARIL) 25 mg capsule, Take 1 capsule by mouth every 4 hours as needed for anxiety, Disp: 540 capsule, Rfl: 3 .  hydrOXYzine pamoate (VISTARIL) 25 mg capsule, Take 1 capsule every 4 hours as needed for anxiety, Disp: 540  capsule, Rfl: 3 .  levothyroxine  (SYNTHROID ) 112 mcg tablet, Take 1 tablet (112 mcg total) by mouth daily. Except on Sunday take 1/2 tablet, Disp: 30 tablet, Rfl: 5 .  levothyroxine  (SYNTHROID ) 112 mcg tablet, Take 1 tablet (112 mcg total) by mouth daily. Except on Sunday take 1/2 tablet, Disp: 90 tablet, Rfl: 3 .  levothyroxine  (SYNTHROID ) 112 mcg tablet, Take 1 tablet (112 mcg total) by mouth Monday through Saturday. Except on Sunday take one-HALF tablet (56 mcg total) by mouth., Disp: 90 tablet, Rfl: 3 .  levothyroxine  (SYNTHROID ) 125 mcg tablet, Take 125 mcg by mouth Once Daily. (Patient not taking: Reported on 10/01/2022), Disp: 30 tablet, Rfl: 5 .  levothyroxine  (SYNTHROID ) 125 mcg tablet, Take 1 tablet (125 mcg total) by mouth daily., Disp: 30 tablet, Rfl: 5 .  levothyroxine  sodium 112 mcg cap, Take 112 mcg by mouth Once Daily. (Patient not taking: Reported on 10/01/2022), Disp: 30 capsule, Rfl: 2 .  levothyroxine  sodium 112 mcg cap, Take 112 mcg by mouth Once Daily. (Patient not taking: Reported on 10/01/2022), Disp: 90 capsule, Rfl: 3 .  levothyroxine  sodium 112 mcg cap, Take 1 capsule (112 mcg total) by mouth daily.,  Disp: 90 capsule, Rfl: 1 .  naloxone 1 mg/mL injection, Indications: decrease in rate & depth of breathing due to opioid drug (Patient not taking: Reported on 10/01/2022), Disp: 4 mL, Rfl: 0 .  prazosin (MINIPRESS) 1 mg capsule, TAKE 1 CAPSULE BY MOUTH EVERY NIGHT, Disp: 30 capsule, Rfl: 1 .  prazosin (MINIPRESS) 1 mg capsule, Take 1 mg by mouth nightly. (Patient not taking: Reported on 10/01/2022), Disp: 90 capsule, Rfl: 3 .  prazosin (MINIPRESS) 1 mg capsule, Take 1 mg by mouth nightly. (Patient not taking: Reported on 10/01/2022), Disp: 90 capsule, Rfl: 3 .  prazosin (MINIPRESS) 1 mg capsule, Take 1 capsule by mouth at bedtime., Disp: 90 capsule, Rfl: 3 .  prazosin (MINIPRESS) 1 mg capsule, Take 1 capsule by mouth at bedtime., Disp: 90 capsule, Rfl: 3 .  prazosin (MINIPRESS) 1 mg capsule, Take 1 capsule by mouth at bedtime., Disp: 90 capsule, Rfl: 3 .  semaglutide , weight loss, (Wegovy ) 0.25 mg/0.5 mL injection pen, Inject 0.25 mg under the skin once a week. (Patient not taking: Reported on 10/01/2022), Disp: 2 mL, Rfl: 0 .  semaglutide , weight loss, (Wegovy ) 0.5 mg/0.5 mL injection pen, Inject 0.5 mg under the skin once a week. (Patient not taking: Reported on 10/01/2022), Disp: 2 mL, Rfl: 0 .  semaglutide , weight loss, (Wegovy ) 1 mg/0.5 mL injection pen, Inject 1 mg under the skin once a week. (Patient not taking: Reported on 10/01/2022), Disp: 2 mL, Rfl: 0 .  semaglutide , weight loss, (Wegovy ) 1.7 mg/0.75 mL injection pen, Inject 1.7 mg under the skin every 7 days. (Patient not taking: Reported on 10/01/2022), Disp: 3 mL, Rfl: 0 .  semaglutide , weight loss, (Wegovy ) 2.4 mg/0.75 mL injection pen, Inject 2.4 mg under the skin every 7 days., Disp: 3 mL, Rfl: 2 .  tirzepatide  (Mounjaro ) 2.5 mg/0.5 mL subcutaneous injection, Inject 2.5 mg into the skin once a week., Disp: 4 mL, Rfl: 2 .  traZODone  (DESYREL ) 300 mg tablet, TAKE 1 AT BEDTIME, Disp: 90 tablet, Rfl: 3 .  traZODone  (DESYREL ) 300 mg tablet, Take 300 mg  by mouth nightly. (Patient not taking: Reported on 10/01/2022), Disp: 90 tablet, Rfl: 3 .  traZODone  (DESYREL ) 300 mg tablet, Take 1 tablet (300 mg total) by mouth at bedtime., Disp: 90 tablet, Rfl: 1 .  traZODone  (  DESYREL ) 300 mg tablet, Take 1 tablet (300 mg total) by mouth at bedtime., Disp: 90 tablet, Rfl: 3 .  traZODone  (DESYREL ) 300 mg tablet, TAKE 1 AT BEDTIME, Disp: 90 tablet, Rfl: 3 [4] Allergies Allergen Reactions  . Opioids - Morphine Analogues GI Intolerance  . Opioids - Morphine Analogues GI Intolerance  [5] Family History Problem Relation Name Age of Onset  . Stroke Mother    . Mental illness Mother    . Mental illness Father    . Thyroid  disease Paternal Grandmother    . Ovarian cancer Paternal Grandmother  51  . Hyperlipidemia Paternal Grandfather    . Substance abuse Paternal Grandfather    . Breast cancer Neg Hx

## 2024-04-28 ENCOUNTER — Encounter: Payer: Self-pay | Admitting: Internal Medicine

## 2024-04-28 ENCOUNTER — Ambulatory Visit: Payer: Self-pay | Admitting: Internal Medicine

## 2024-04-28 VITALS — BP 116/76 | HR 80 | Temp 98.2°F | Resp 16 | Ht 59.0 in | Wt 123.6 lb

## 2024-04-28 DIAGNOSIS — E039 Hypothyroidism, unspecified: Secondary | ICD-10-CM

## 2024-04-28 DIAGNOSIS — E559 Vitamin D deficiency, unspecified: Secondary | ICD-10-CM

## 2024-04-28 DIAGNOSIS — M8580 Other specified disorders of bone density and structure, unspecified site: Secondary | ICD-10-CM | POA: Insufficient documentation

## 2024-04-28 NOTE — Assessment & Plan Note (Signed)
 Her Vit D level was 100 near toxic levels in 08/2023.  We will recheck her Vit D.  She states she only takes Vit D 1000 Units daily.  We will stop this and recheck it on her next visit.

## 2024-04-28 NOTE — Assessment & Plan Note (Signed)
 She has osteopenia seen on her hip xray.  She needs a bone density.

## 2024-04-28 NOTE — Progress Notes (Signed)
 Office Visit  Subjective   Patient ID: Alice Pena   DOB: 1961/05/29   Age: 63 y.o.   MRN: [id number]   Chief Complaint Chief Complaint  Patient presents with   Follow-up    3 Month follow up     History of Present Illness The patient is a 63 year old Caucasian/White female who returns for a regularly scheduled thyroid  check.  We did her TFT' about 6 months ago and they were normal and stable.  Earlier in the year, her TSH was slightly abnormal and her Free T4 was normal rnage.  We continued her on tirosint  112mcg daily except on Sunday's she takes 66mcg.  She does not see endocrinology anymore.  She denies any heat intolerance, unexplained weight changes, denies fatigue, tremors, anxiety, or constipation.   She is having some dry skin and is always cold but she attributes this to her past chemotherapy.  She also saw ortho last week due to bilateral hip pain.  They did a pelvic xray and this showed moderate right hip DJD and mild to moderate left hip DJD.  No acute fracture.  Positive L-spine DDD noted.  Positive SI joint DJD.  Osteopenia.  They felt she had primary osteoarthritis of her hips and wanted her to start physical therapy.  They had concern about doing intra-articular steroid injections due to the patient's history of cancer and undergoing radiation and chemotherapy.  They discussed the benefit of a bone density scan and she comes in today to discuss this.       Past Medical History Past Medical History:  Diagnosis Date   Arthritis    Depression    post trauma    Headache(784.0)    History of rectal or anal cancer    Hypothyroidism    Migraines    Neuropathy    Thyroid  disease    Tongue cancer (HCC)    Urine incontinence      Allergies Allergies  Allergen Reactions   Morphine And Codeine Nausea And Vomiting     Medications  Current Outpatient Medications:    brexpiprazole  (REXULTI ) 1 MG TABS tablet, Take 1 tablet (1 mg total) by mouth daily., Disp: 90  tablet, Rfl: 3   DULoxetine  (CYMBALTA ) 30 MG capsule, Take 3 capsules (90 mg total) by mouth 2 (two) times daily., Disp: 540 capsule, Rfl: 1   levothyroxine  (SYNTHROID ) 112 MCG tablet, Take 1 tablet (112 mcg total) by mouth daily. Except on Sunday take 1/2 tablet, Disp: 90 tablet, Rfl: 3   prazosin (MINIPRESS) 1 MG capsule, Take 1 capsule by mouth at bedtime., Disp: , Rfl:    trazodone  (DESYREL ) 300 MG tablet, Take 1 tablet (300 mg total) by mouth at bedtime., Disp: 90 tablet, Rfl: 1   Review of Systems Review of Systems  Constitutional:  Negative for chills, fever, malaise/fatigue and weight loss.  Respiratory:  Negative for shortness of breath.   Cardiovascular:  Negative for chest pain and leg swelling.  Gastrointestinal:  Negative for abdominal pain, constipation, diarrhea, nausea and vomiting.  Genitourinary:  Negative for frequency.  Musculoskeletal:  Positive for joint pain.  Neurological:  Negative for dizziness, weakness and headaches.  Endo/Heme/Allergies:  Negative for polydipsia.       Objective:    Vitals BP 116/76   Pulse 80   Temp 98.2 F (36.8 C) (Temporal)   Resp 16   Ht 4' 11 (1.499 m)   Wt 123 lb 9.6 oz (56.1 kg)   LMP 01/08/2012  SpO2 99%   BMI 24.96 kg/m    Physical Examination Physical Exam Constitutional:      Appearance: Normal appearance. She is not ill-appearing.  Neck:     Comments: No thyroid  abnormalities felt. Cardiovascular:     Rate and Rhythm: Normal rate and regular rhythm.     Pulses: Normal pulses.     Heart sounds: No murmur heard.    No friction rub. No gallop.  Pulmonary:     Effort: Pulmonary effort is normal. No respiratory distress.     Breath sounds: No wheezing, rhonchi or rales.  Abdominal:     General: Bowel sounds are normal. There is no distension.     Palpations: Abdomen is soft.     Tenderness: There is no abdominal tenderness.  Musculoskeletal:     Right lower leg: No edema.     Left lower leg: No edema.   Skin:    General: Skin is warm and dry.     Findings: No rash.  Neurological:     Mental Status: She is alert.        Assessment & Plan:   Vitamin D  deficiency Her Vit D level was 100 near toxic levels in 08/2023.  We will recheck her Vit D.  She states she only takes Vit D 1000 Units daily.  We will stop this and recheck it on her next visit.  Hypothyroidism We will check her TFT's today.  She seems euthyroid.  Osteopenia She has osteopenia seen on her hip xray.  She needs a bone density.    Return in about 3 months (around 07/29/2024).   Selinda Fleeta Finger, MD

## 2024-04-28 NOTE — Assessment & Plan Note (Signed)
We will check her TFT's today.  She seems euthyroid.

## 2024-04-28 NOTE — Addendum Note (Signed)
 Addended by: LENETTA LACKS on: 04/28/2024 04:33 PM   Modules accepted: Orders

## 2024-05-03 ENCOUNTER — Ambulatory Visit: Payer: Self-pay | Admitting: Internal Medicine

## 2024-06-01 ENCOUNTER — Telehealth: Payer: Self-pay

## 2024-06-01 NOTE — Telephone Encounter (Signed)
 I have called and informed the patient that Dr. Fleeta Finger stated  TSH a little lower. I wouldn't change any meds.SABRA

## 2024-07-26 ENCOUNTER — Encounter: Payer: Self-pay | Admitting: Internal Medicine
